# Patient Record
Sex: Female | Born: 1985 | Race: White | Hispanic: No | Marital: Married | Smoking: Former smoker
Health system: Southern US, Community
[De-identification: ages and names within clinical notes are randomized; demographics above are authoritative.]

## PROBLEM LIST (undated history)

## (undated) ENCOUNTER — Inpatient Hospital Stay (HOSPITAL_COMMUNITY): Payer: Self-pay

## (undated) DIAGNOSIS — F419 Anxiety disorder, unspecified: Secondary | ICD-10-CM

## (undated) DIAGNOSIS — IMO0002 Reserved for concepts with insufficient information to code with codable children: Secondary | ICD-10-CM

## (undated) DIAGNOSIS — F329 Major depressive disorder, single episode, unspecified: Secondary | ICD-10-CM

## (undated) DIAGNOSIS — Z8669 Personal history of other diseases of the nervous system and sense organs: Secondary | ICD-10-CM

## (undated) DIAGNOSIS — G43909 Migraine, unspecified, not intractable, without status migrainosus: Secondary | ICD-10-CM

## (undated) DIAGNOSIS — F32A Depression, unspecified: Secondary | ICD-10-CM

## (undated) DIAGNOSIS — R87619 Unspecified abnormal cytological findings in specimens from cervix uteri: Secondary | ICD-10-CM

## (undated) DIAGNOSIS — R87629 Unspecified abnormal cytological findings in specimens from vagina: Secondary | ICD-10-CM

## (undated) DIAGNOSIS — N200 Calculus of kidney: Secondary | ICD-10-CM

## (undated) DIAGNOSIS — B999 Unspecified infectious disease: Secondary | ICD-10-CM

## (undated) HISTORY — DX: Personal history of other diseases of the nervous system and sense organs: Z86.69

## (undated) HISTORY — DX: Anxiety disorder, unspecified: F41.9

## (undated) HISTORY — DX: Unspecified infectious disease: B99.9

## (undated) HISTORY — PX: TOOTH EXTRACTION: SUR596

## (undated) HISTORY — PX: WISDOM TOOTH EXTRACTION: SHX21

---
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
## 2005-03-06 ENCOUNTER — Emergency Department (HOSPITAL_COMMUNITY): Admission: EM | Admit: 2005-03-06 | Discharge: 2005-03-06 | Payer: Self-pay | Admitting: Emergency Medicine

## 2005-04-28 DIAGNOSIS — R87629 Unspecified abnormal cytological findings in specimens from vagina: Secondary | ICD-10-CM

## 2005-04-28 HISTORY — DX: Unspecified abnormal cytological findings in specimens from vagina: R87.629

## 2005-05-20 ENCOUNTER — Emergency Department (HOSPITAL_COMMUNITY): Admission: EM | Admit: 2005-05-20 | Discharge: 2005-05-20 | Payer: Self-pay | Admitting: Emergency Medicine

## 2005-05-20 IMAGING — CR DG CHEST 2V
2 series · 2 of 2 positions shown · non-contrast
Comparison: none

CLINICAL DATA: Left chest pain

Chest 2 view:
No previous for comparison. The heart size and mediastinal contours are within
normal limits.  Both lungs are clear.  The visualized skeletal structures are
unremarkable.

[w chest pa *]
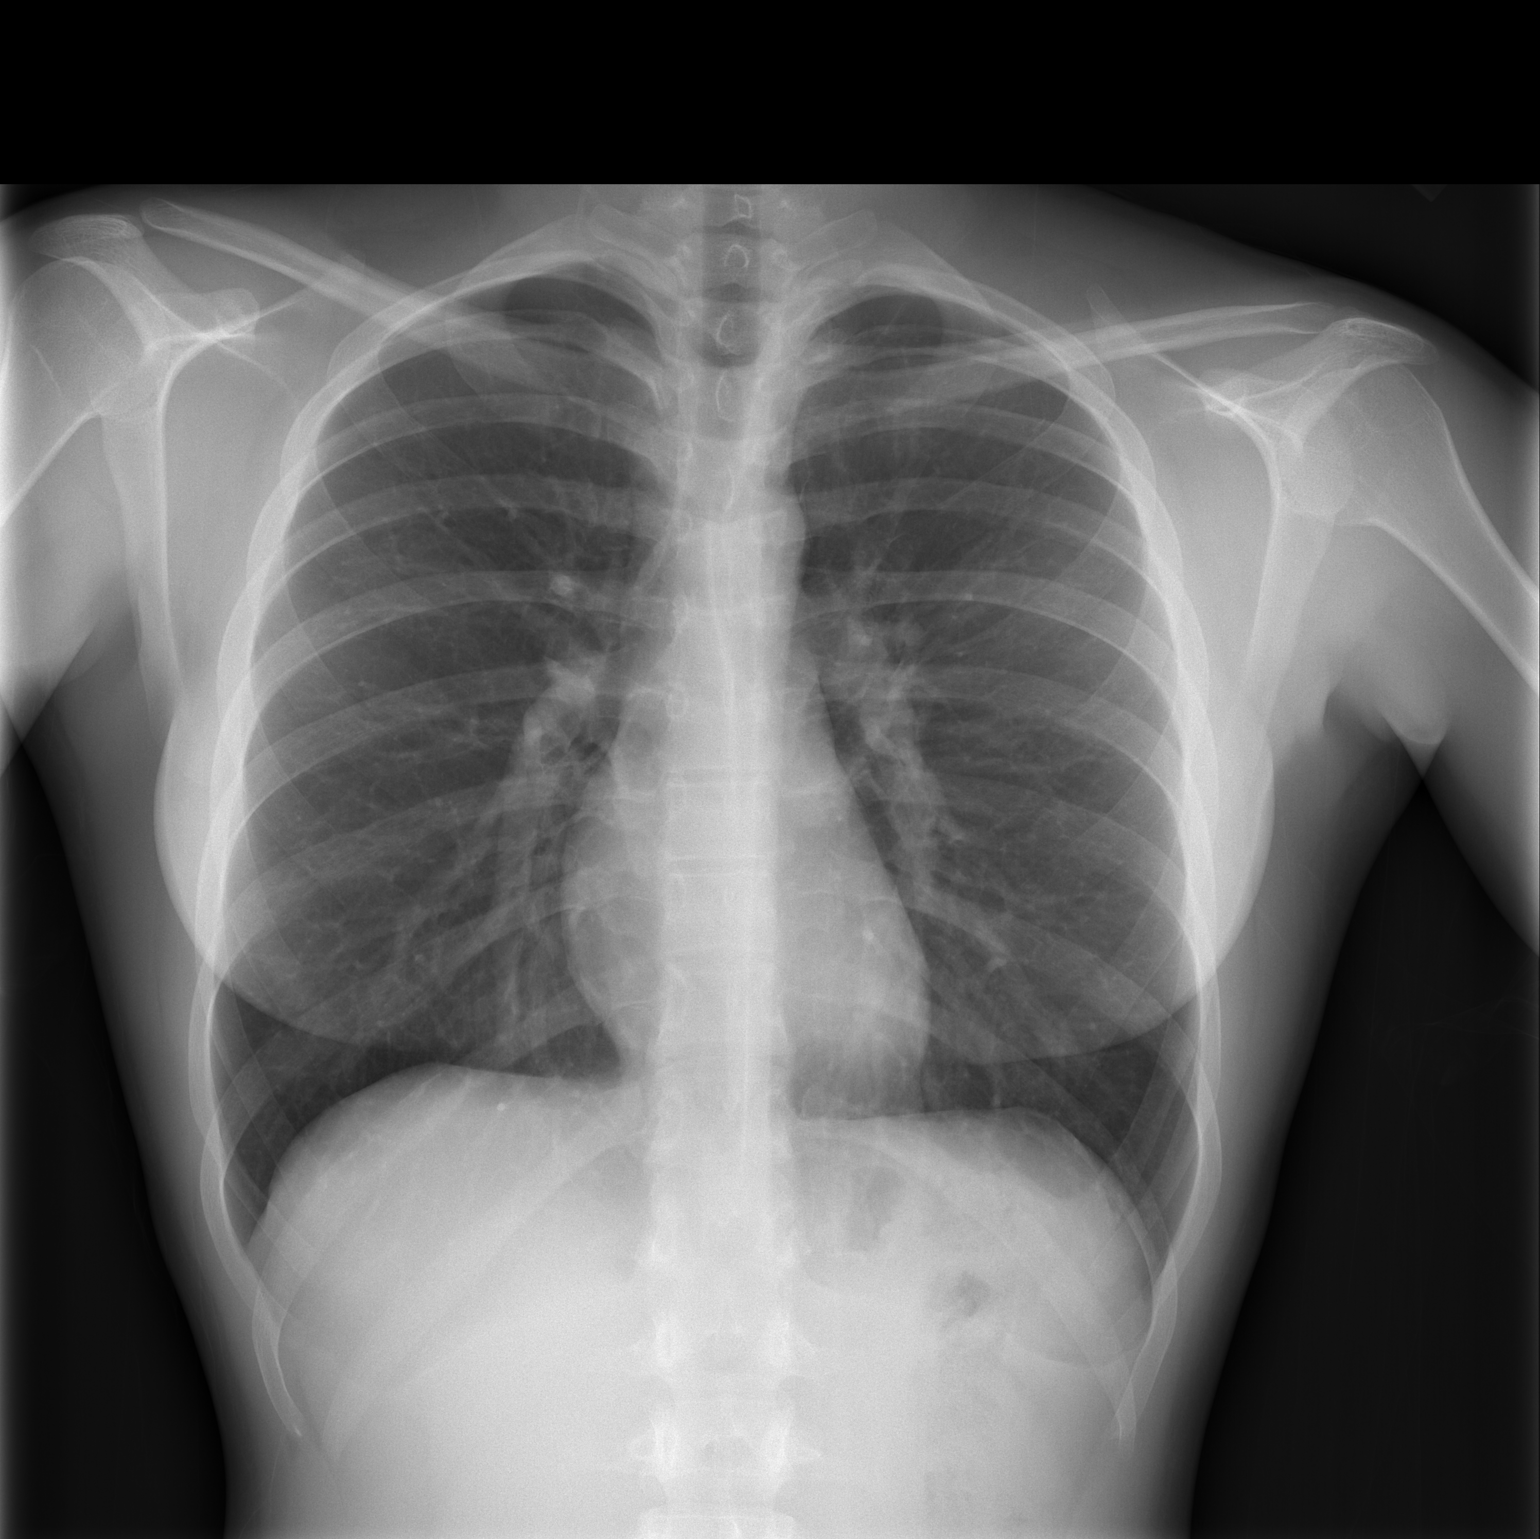

[w chest lat *]
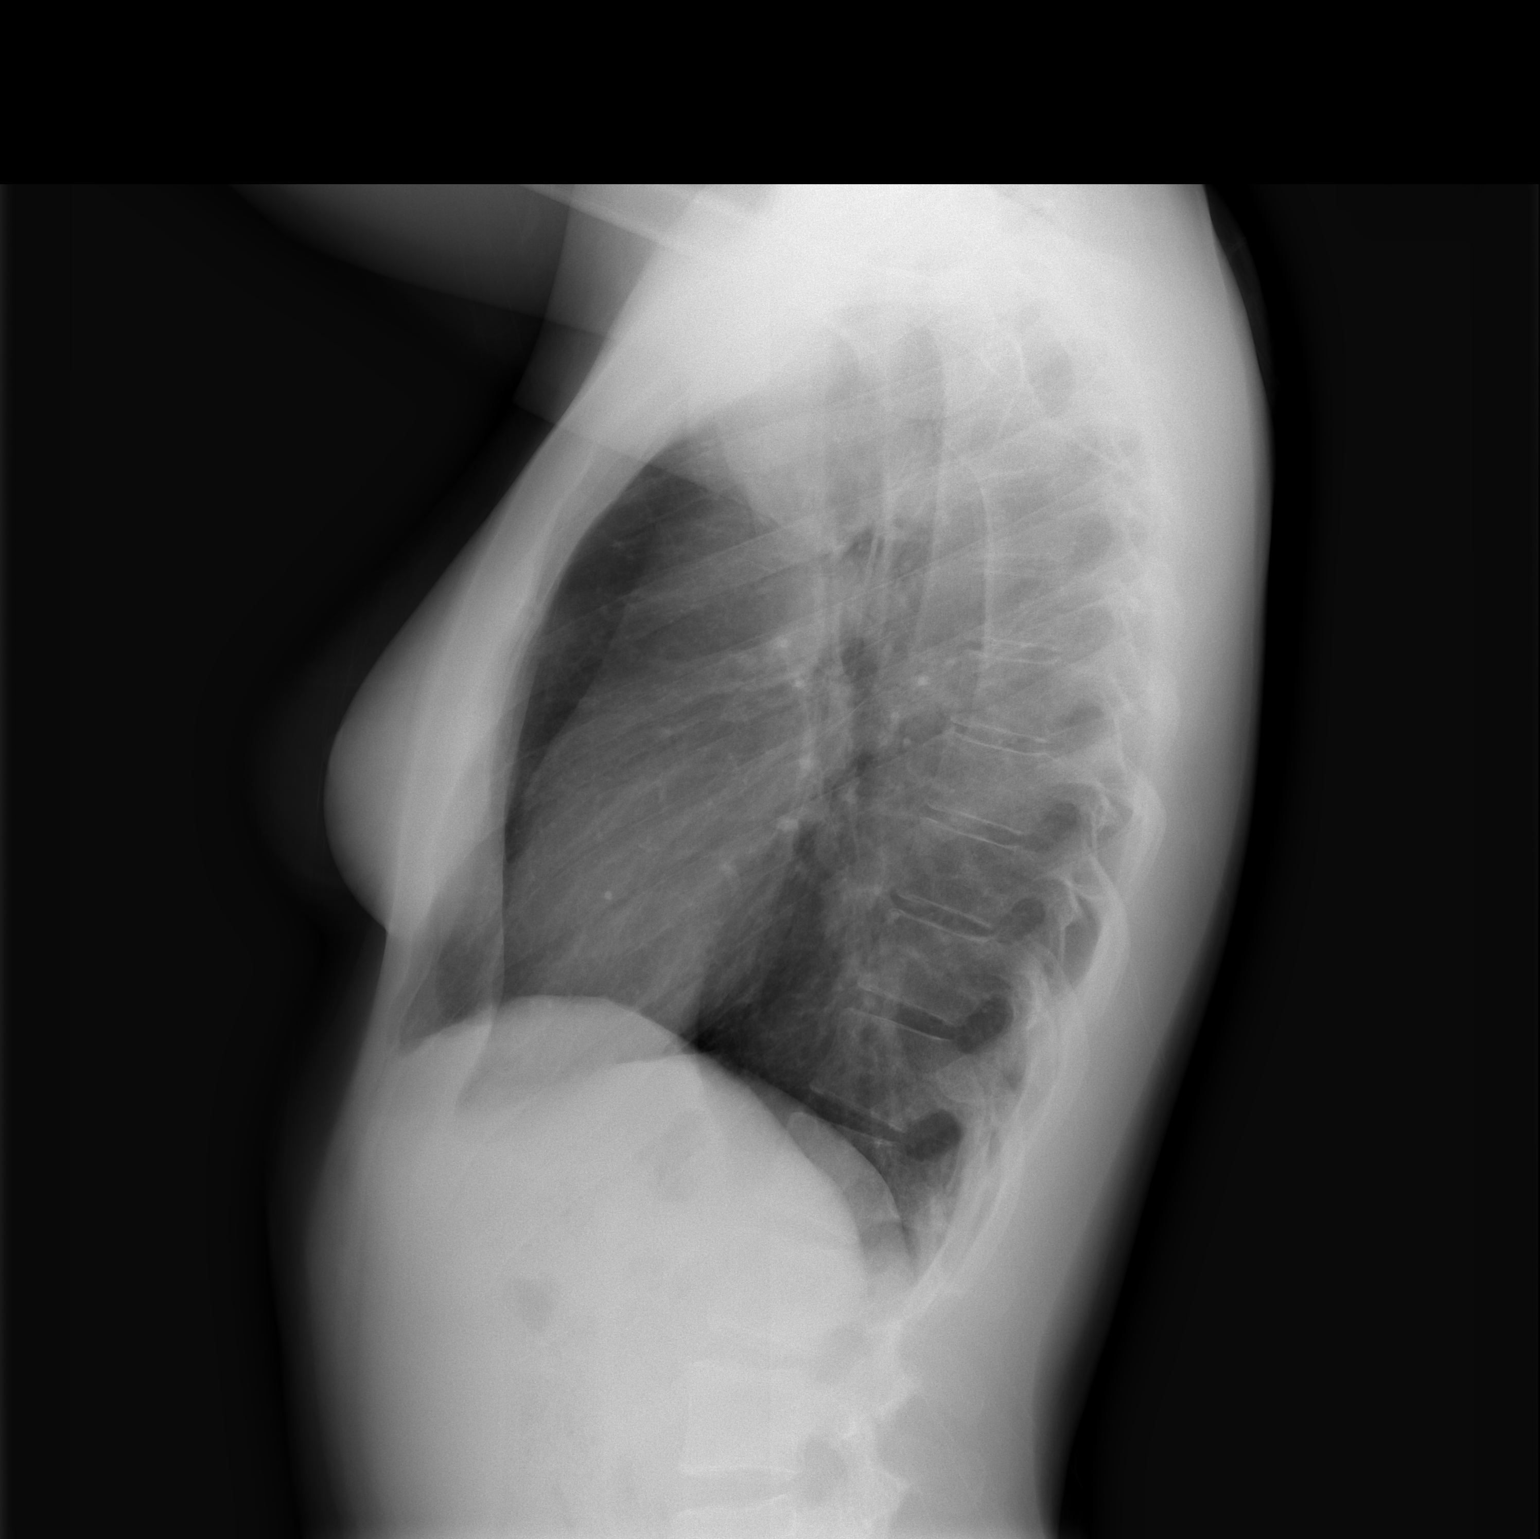

[2 of 2 positions shown; findings below may reference images not displayed]

IMPRESSION: 1. No active cardiopulmonary disease.

## 2011-02-06 ENCOUNTER — Ambulatory Visit (INDEPENDENT_AMBULATORY_CARE_PROVIDER_SITE_OTHER): Payer: Self-pay | Admitting: Gynecology

## 2011-02-06 DIAGNOSIS — Z3400 Encounter for supervision of normal first pregnancy, unspecified trimester: Secondary | ICD-10-CM

## 2011-02-06 DIAGNOSIS — O3680X0 Pregnancy with inconclusive fetal viability, not applicable or unspecified: Secondary | ICD-10-CM

## 2011-02-06 DIAGNOSIS — Z23 Encounter for immunization: Secondary | ICD-10-CM

## 2011-02-07 LAB — OBSTETRIC PANEL
Basophils Absolute: 0 10*3/uL (ref 0.0–0.1)
Eosinophils Absolute: 0.1 10*3/uL (ref 0.0–0.7)
Eosinophils Relative: 1 % (ref 0–5)
HCT: 38.4 % (ref 36.0–46.0)
Hepatitis B Surface Ag: NEGATIVE
MCHC: 33.6 g/dL (ref 30.0–36.0)
MCV: 89.1 fL (ref 78.0–100.0)
Platelets: 249 10*3/uL (ref 150–400)
RDW: 12.9 % (ref 11.5–15.5)

## 2011-02-08 LAB — CULTURE, URINE COMPREHENSIVE
Colony Count: NO GROWTH
Organism ID, Bacteria: NO GROWTH

## 2011-02-12 ENCOUNTER — Ambulatory Visit (HOSPITAL_COMMUNITY)
Admission: RE | Admit: 2011-02-12 | Discharge: 2011-02-12 | Disposition: A | Discharge status: Home / Self Care | Payer: Medicaid Other | Source: Ambulatory Visit | Attending: Family Medicine | Admitting: Family Medicine

## 2011-02-12 DIAGNOSIS — Z3689 Encounter for other specified antenatal screening: Secondary | ICD-10-CM | POA: Insufficient documentation

## 2011-02-12 DIAGNOSIS — O3680X0 Pregnancy with inconclusive fetal viability, not applicable or unspecified: Secondary | ICD-10-CM

## 2011-02-12 IMAGING — US US OB TRANSVAGINAL
1 series · 14 of 28 positions shown · non-contrast
Comparison: none

[Series 1: us ob comp less 14 wks · 14 of 53 slices shown]
[im 2/53]
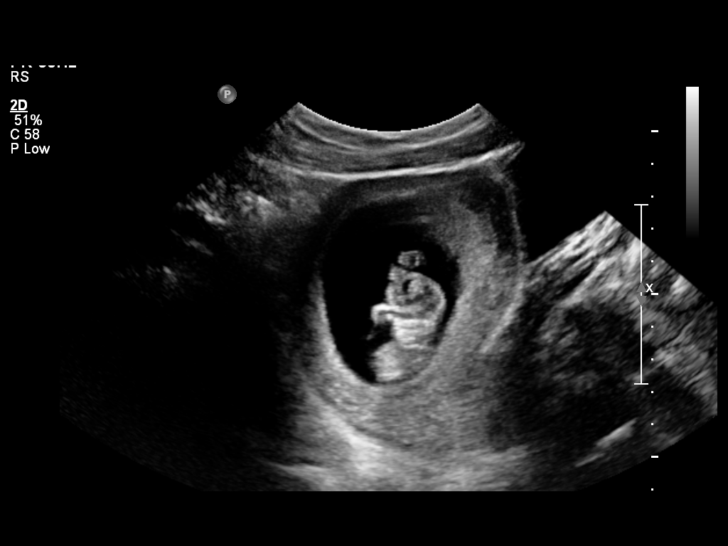
[im 6/53]
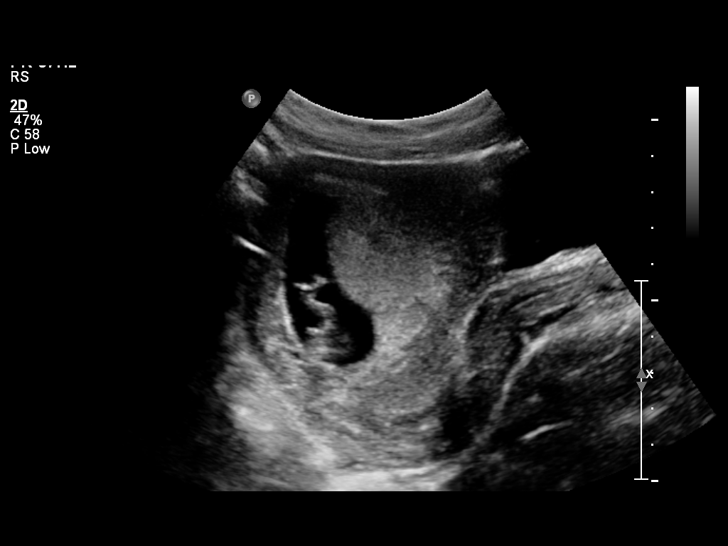
[im 10/53]
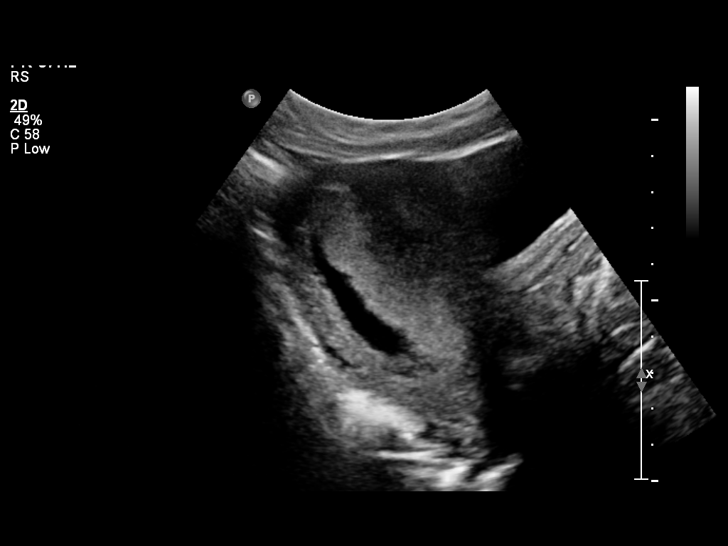
[im 14/53]
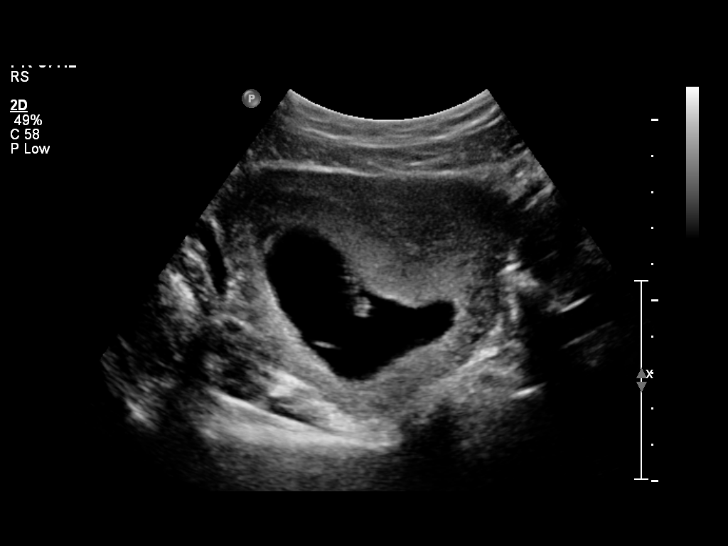
[im 18/53]
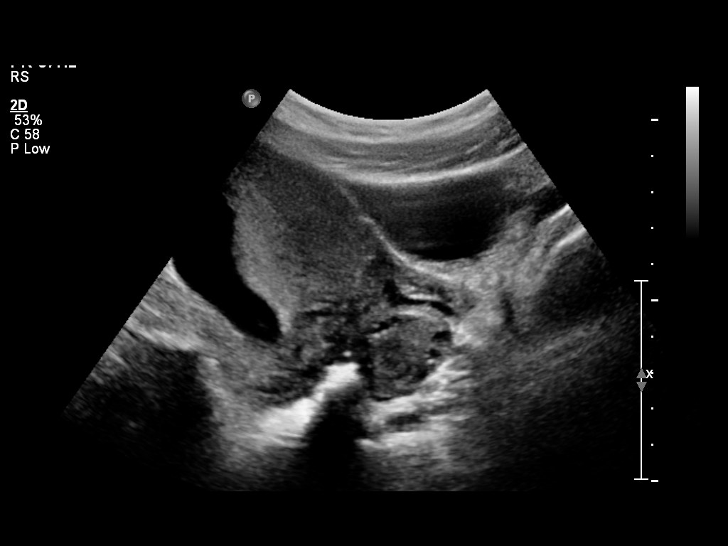
[im 22/53]
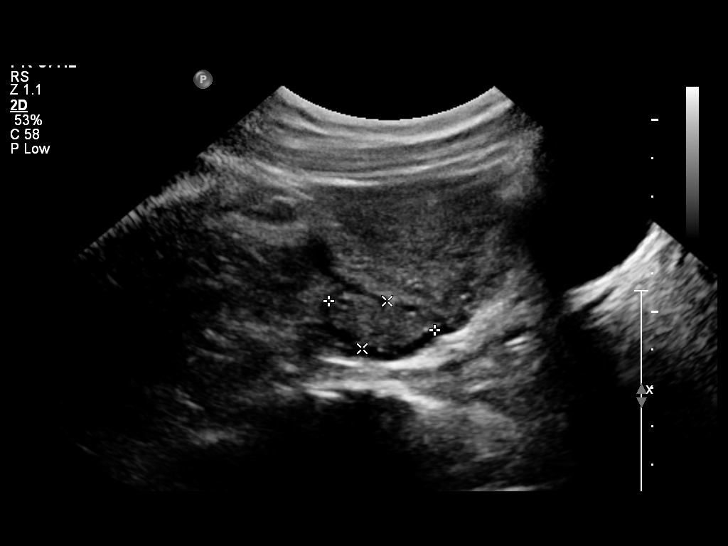
[im 26/53]
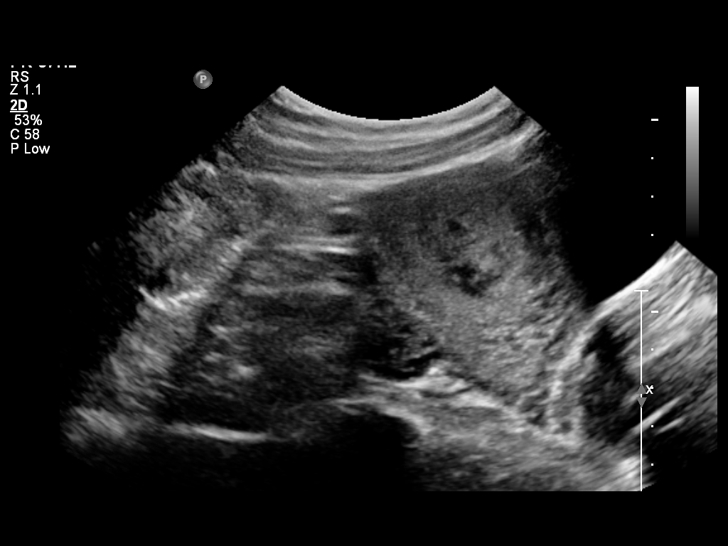
[im 29/53]
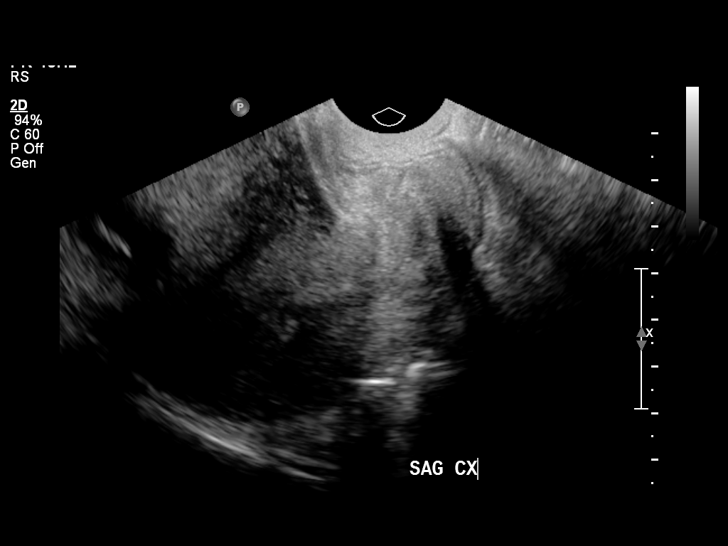
[im 33/53]
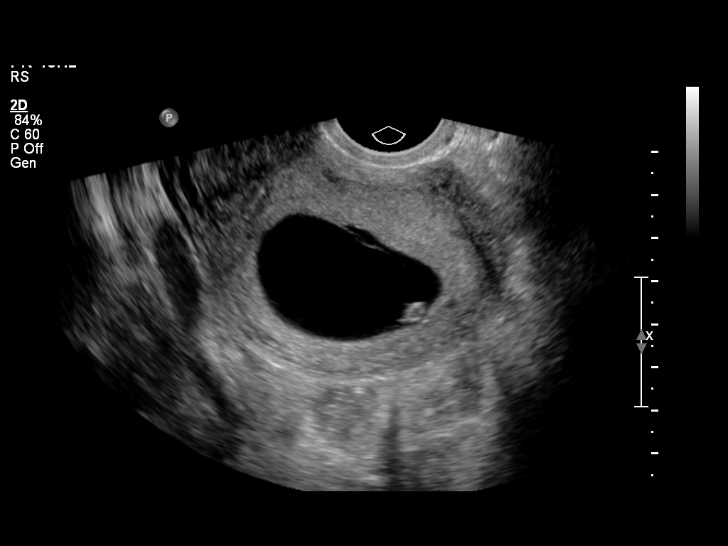
[im 37/53]
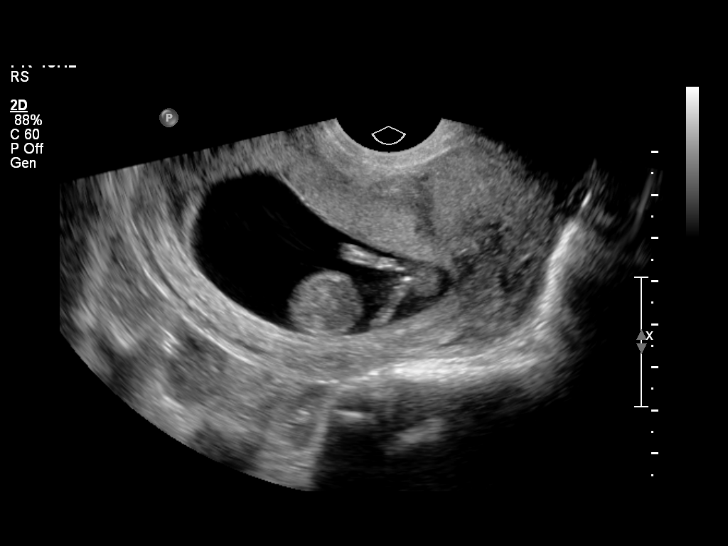
[im 41/53]
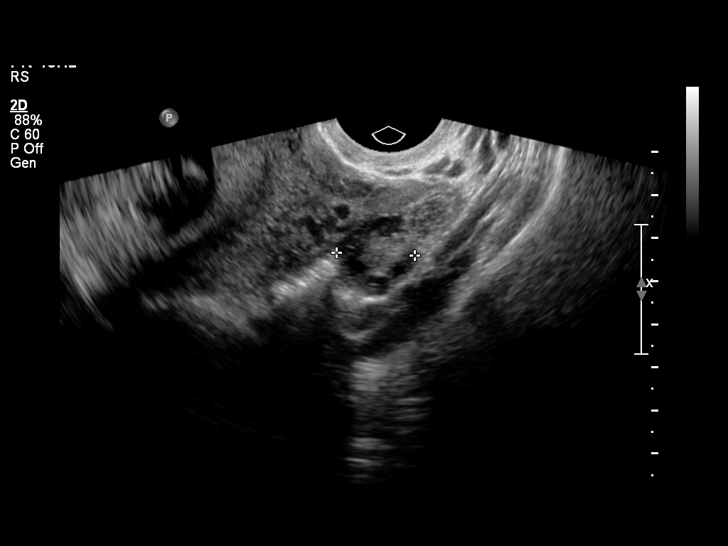
[im 45/53]
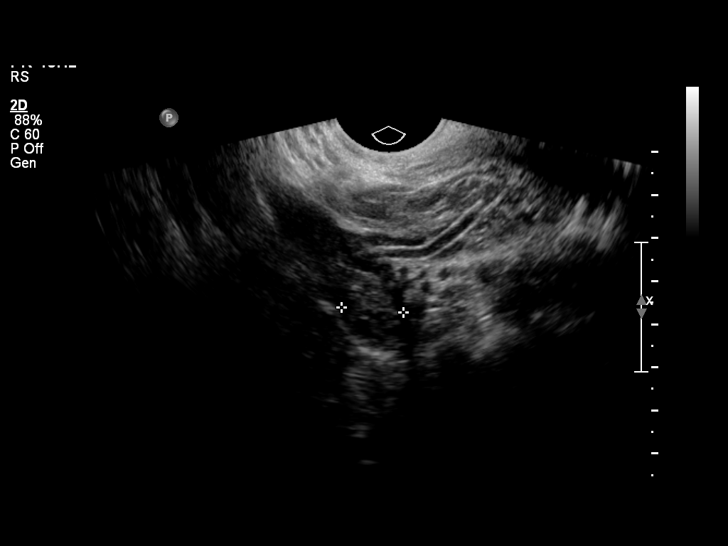
[im 49/53]
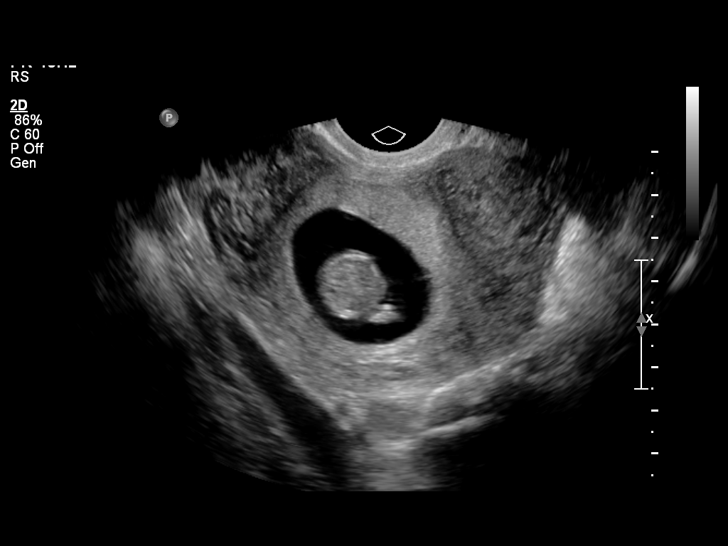
[im 53/53]
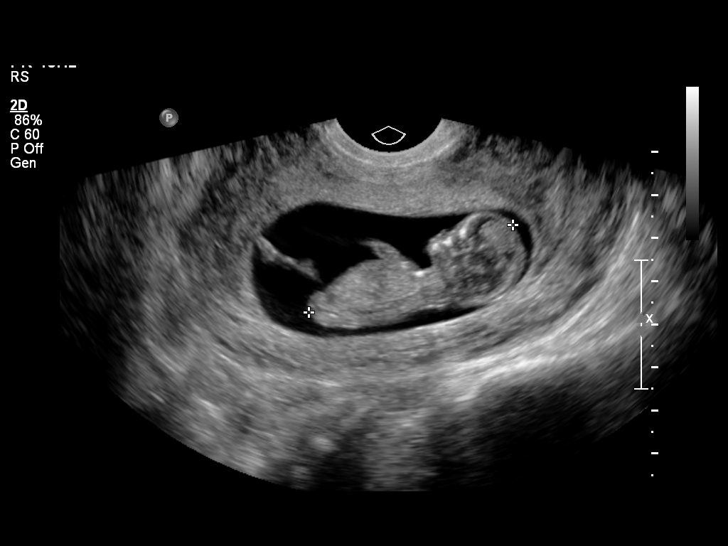

[14 of 28 positions shown; findings below may reference images not displayed]

OBSTETRICS REPORT
                      (Signed Final 02/12/2011 [DATE])

 Order#:         [id number]
                 46_O
Procedures

 US OB COMP LESS 14 WKS                                76801.0
 US OB TRANSVAGINAL                                    76817.0
Indications

 Unsure of LMP;  Establish Gestational [AGE]
Fetal Evaluation

 Fetal Heart Rate:  174                          bpm
 Cardiac Activity:  Observed
 Presentation:      Variable
 Placenta:          Anterior

 Amniotic Fluid
 AFI FV:      Subjectively within normal limits
Biometry

 CRL:     51.5  mm     G. Age:  11w 5d                 EDD:    08/29/11
Gestational Age

 LMP:           11w 2d        Date:  11/25/10                 EDD:   09/01/11
 Best:          11w 5d     Det. By:  U/S C R L (02/12/11)     EDD:   08/29/11
Cervix Uterus Adnexa

 Cervix:       Closed.

 Left Ovary:    Within normal limits. 3.2cm x 1.7cm x 1.8cm
 Right Ovary:   Within normal limits. 3.1cm x 1.2cm x 1.4cm
Impression

 Single living IUP with US Gest. Age of 11w 5d, and EDD of
 08/29/2011.
Recommendations

 US for fetal anatomic evaluation at 18-19 wks GA.

## 2011-02-19 ENCOUNTER — Ambulatory Visit (INDEPENDENT_AMBULATORY_CARE_PROVIDER_SITE_OTHER): Payer: Self-pay | Admitting: Family Medicine

## 2011-02-19 ENCOUNTER — Encounter: Payer: Self-pay | Admitting: Family Medicine

## 2011-02-19 VITALS — BP 108/64 | Wt 138.0 lb

## 2011-02-19 DIAGNOSIS — Z113 Encounter for screening for infections with a predominantly sexual mode of transmission: Secondary | ICD-10-CM

## 2011-02-19 DIAGNOSIS — Z1272 Encounter for screening for malignant neoplasm of vagina: Secondary | ICD-10-CM

## 2011-02-19 DIAGNOSIS — Z3480 Encounter for supervision of other normal pregnancy, unspecified trimester: Secondary | ICD-10-CM | POA: Insufficient documentation

## 2011-02-19 NOTE — Progress Notes (Signed)
Needs First screen.  S/p flu shot.  Subjective:    Joan Orozco is a G1P0 [redacted]w[redacted]d being seen today for her first obstetrical visit.  Her obstetrical history is significant for nothing. Patient does intend to breast feed. Pregnancy history fully reviewed.  Patient reports no complaints.  Filed Vitals:   02/19/11 0900  BP: 108/64  Weight: 138 lb (62.596 kg)    HISTORY: OB History    Grav Para Term Preterm Abortions TAB SAB Ect Mult Living   1              # Outc Date GA Lbr Len/2nd Wgt Sex Del Anes PTL Lv   1 CUR              Past Medical History  Diagnosis Date  . Anxiety     at younger age. was on lexapro  . Infection     bladder, yeast, uri.  . Asthma     childhood   Past Surgical History  Procedure Date  . Wisdom tooth extraction   . Tooth extraction    Family History  Problem Relation Age of Onset  . Adopted: Yes     Exam    Uterine Size: size equals dates  Pelvic Exam:    Perineum: No Hemorrhoids, Normal Perineum   Vulva: normal   Vagina:  normal mucosa       Cervix: anteverted   Adnexa: normal adnexa   Bony Pelvis: gynecoid       Skin: normal coloration and turgor, no rashes    Neurologic: oriented   Extremities: normal strength, tone, and muscle mass   HEENT PERRLA   Mouth/Teeth mucous membranes moist, pharynx normal without lesions   Neck supple   Cardiovascular: regular rate and rhythm   Respiratory:  appears well, vitals normal, no respiratory distress, acyanotic, normal RR, ear and throat exam is normal, neck free of mass or lymphadenopathy, chest clear, no wheezing, crepitations, rhonchi, normal symmetric air entry   Abdomen: soft, non-tender; bowel sounds normal; no masses,  no organomegaly   Urinary: urethral meatus normal      Assessment:    Pregnancy: G1P0 Patient Active Problem List  Diagnoses  . Supervision of other normal pregnancy        Plan:     Initial labs drawn. Prenatal vitamins. Problem list reviewed  and updated. Genetic Screening discussed First Screen: ordered.  Ultrasound discussed; fetal survey: discussed.  Follow up in 4 weeks.   Justino Boze S 02/19/2011

## 2011-02-19 NOTE — Patient Instructions (Addendum)
Prenatal Care  WHAT IS PRENATAL CARE?  Prenatal care means health care during your pregnancy, before your baby is born. Take care of yourself and your baby by:   Getting early prenatal care. If you know you are pregnant, or think you might be pregnant, call your caregiver as soon as possible. Schedule a visit for a general/prenatal examination.   Getting regular prenatal care. Follow your caregiver's schedule for blood and other necessary tests. Do not miss appointments.   Do everything you can to keep yourself and your baby healthy during your pregnancy.   Prenatal care should include evaluation of medical, dietary, educational, psychological, and social needs for the couple and the medical, surgical, and genetic history of the family of the mother and father.   Discuss with your caregiver:   Your medicines, prescription, over-the-counter, and herbal medicines.   Substance abuse, alcohol, smoking, and illegal drugs.   Domestic abuse and violence, if present.   Your immunizations.   Nutrition and diet.   Exercising.   Environment and occupational hazards, at home and at work.   History of sexually transmitted disease, both you and your partner.   Previous pregnancies.  WHY IS PRENATAL CARE SO IMPORTANT?  By seeing you regularly, your caregiver has the chance to find problems early, so that they can be treated as soon as possible. Other problems might be prevented. Many studies have shown that early and regular prenatal care is important for the health of both mothers and their babies.  I AM THINKING ABOUT GETTING PREGNANT. HOW CAN I TAKE CARE OF MYSELF?  Taking care of yourself before you get pregnant helps you to have a healthy pregnancy. It also lowers your chances of having a baby born with a birth defect. Here are ways to take care of yourself before you get pregnant:   Eat healthy foods, exercise regularly (30 minutes per day for most days of the week is best), and get enough  rest and sleep. Talk to your caregiver about what kinds of foods and exercises are best for you.   Take 400 micrograms (mcg) of folic acid (one of the B vitamins) every day. The best way to do this is to take a daily multivitamin pill that contains this amount of folic acid. Getting enough of the synthetic (manufactured) form of folic acid every day before you get pregnant and during early pregnancy can help prevent certain birth defects. Many breakfast cereals and other grain products have folic acid added to them, but only certain cereals contain 400 mcg of folic acid per serving. Check the label on your multivitamin or cereal to find the amount of folic acid in the food.   See your caregiver for a complete check up before getting pregnant. Make sure that you have had all your immunization shots, especially for rubella ([place] measles). Rubella can cause serious birth defects. Chickenpox is another illness you want to avoid during pregnancy. If you have had chickenpox and rubella in the past, you should be immune to them.   Tell your caregiver about any prescription or non-prescription medicines (including herbal remedies) you are taking. Some medicines are not safe to take during pregnancy.   Stop smoking cigarettes, drinking alcohol, or taking illegal drugs. Ask your caregiver for help, if you need it. You can also get help with alcohol and drugs by talking with a member of your faith community, a counselor, or a trusted friend.   Discuss and treat any medical, social, or psychological   problems before getting pregnant.   Discuss any history of genetic problems in the mother, father, and their families. Do genetic testing before getting pregnant, when possible.   Discuss any physical or emotional abuse with your caregiver.   Discuss with your caregiver if you might be exposed to harmful chemicals on your job or where you live.   Discuss with your caregiver if you think your job or the hours you  work may be harmful and should be changed.   The father should be involved with the decision making and with all aspects of the pregnancy, labor, and delivery.   If you have medical insurance, make sure you are covered for pregnancy.  I JUST FOUND OUT THAT I AM PREGNANT. HOW CAN I TAKE CARE OF MYSELF?  Here are ways to take care of yourself and the precious new life growing inside you:   Continue taking your multivitamin with 400 micrograms (mcg) of folic acid every day.   Get early and regular prenatal care. It does not matter if this is your first pregnancy or if you already have children. It is very important to see a caregiver during your pregnancy. Your caregiver will check at each visit to make sure that you and the baby are healthy. If there are any problems, action can be taken right away to help you and the baby.   Eat a healthy diet that includes:   Fruits.   Vegetables.   Foods low in saturated fat.   Grains.   Calcium-rich foods.   Drink 6 to 8 glasses of liquids a day.   Unless your caregiver tells you not to, try to be physically active for 30 minutes, most days of the week. If you are pressed for time, you can get your activity in through 10 minute segments, three times a day.   If you smoke, drink alcohol, or use drugs, STOP. These can cause long-term damage to your baby. Talk with your caregiver about steps to take to stop smoking. Talk with a member of your faith community, a counselor, a trusted friend, or your caregiver if you are concerned about your alcohol or drug use.   Ask your caregiver before taking any medicine, even over-the-counter medicines. Some medicines are not safe to take during pregnancy.   Get plenty of rest and sleep.   Avoid hot tubs and saunas during pregnancy.   Do not have X-rays taken, unless absolutely necessary and with the recommendation of your caregiver. A lead shield can be placed on your abdomen, to protect the baby when X-rays  are taken in other parts of the body.   Do not empty the cat litter when you are pregnant. It may contain a parasite that causes an infection called toxoplasmosis, which can cause birth defects. Also, use gloves when working in garden areas used by cats.   Do not eat uncooked or undercooked cheese, meats, or fish.   Stay away from toxic chemicals like:   Insecticides.   Solvents (some cleaners or paint thinners).   Lead.   Mercury.   Sexual relations may continue until the end of the pregnancy, unless you have a medical problem or there is a problem with the pregnancy and your caregiver tells you not to.   Do not wear high heel shoes, especially during the second half of the pregnancy. You can lose your balance and fall.   Do not take long trips, unless absolutely necessary. Be sure to see your caregiver before   going on the trip.   Do not sit in one position for more than 2 hours, when on a trip.   Take a copy of your medical records when going on a trip.   Know where there is a hospital in the city you are visiting, in case of an emergency.   Most dangerous household products will have pregnancy warnings on their labels. Ask your caregiver about products if you are unsure.   Limit or eliminate your caffeine intake from coffee, tea, sodas, medicines, and chocolate.   Many women continue working through pregnancy. Staying active might help you stay healthier. If you have a question about the safety or the hours you work at your particular job, talk with your caregiver.   Get informed:   Read books.   Watch videos.   Go to childbirth classes for you and the father.   Talk with experienced moms.   Ask your caregiver about childbirth education classes for you and your partner. Classes can help you and your partner prepare for the birth of your baby.   Ask about a pediatrician (baby doctor) and methods and pain medicine for labor, delivery, and possible Cesarean delivery  (C-section).  I AM NOT THINKING ABOUT GETTING PREGNANT RIGHT NOW, BUT HEARD THAT ALL WOMEN SHOULD TAKE FOLIC ACID EVERY DAY?  All women of childbearing age, with even a remote chance of getting pregnant, should try to make sure they get enough folic acid. Many pregnancies are not planned. Many women do not know they are actually pregnant early in their pregnancies, and certain birth defects happen in the very early part of pregnancy. Taking 400 micrograms (mcg) of folic acid every day will help prevent certain birth defects that happen in the early part of pregnancy. If a woman begins taking vitamin pills in the second or third month of pregnancy, it may be too late to prevent birth defects. Folic acid may also have other health benefits for women, besides preventing birth defects.  HOW OFTEN SHOULD I SEE MY CAREGIVER DURING PREGNANCY?  Your caregiver will give you a schedule for your prenatal visits. You will have visits more often as you get closer to the end of your pregnancy. An average pregnancy lasts about 40 weeks.  A typical schedule includes visiting your caregiver:   About once each month, during your first 6 months of pregnancy.   Every 2 weeks, during the next 2 months.   Weekly in the last month, until the delivery date.  Your caregiver will probably want to see you more often if:  You are over 35.   Your pregnancy is high risk, because you have certain health problems or problems with the pregnancy, such as:   Diabetes.   High blood pressure.   The baby is not growing on schedule, according to the dates of the pregnancy.  Your caregiver will do special tests, to make sure you and the baby are not having any serious problems. WHAT HAPPENS DURING PRENATAL VISITS?   At your first prenatal visit, your caregiver will talk to you about you and your partner's health history and your family's health history, and will do a physical exam.   On your first visit, a physical exam will  include checks of your blood pressure, height and weight, and an exam of your pelvic organs. Your caregiver will do a Pap test if you have not had one recently, and will do cultures of your cervix to make sure there is no   infection.   At each visit, there will be tests of your blood, urine, blood pressure, weight, and checking the progress of the baby.   Your caregiver will be able to tell you when to expect that your baby will be born.   Each visit is also a chance for you to learn about staying healthy during pregnancy and for asking questions.   Discuss whether you will be breastfeeding.   At your later prenatal visits, your caregiver will check how you are doing and how the baby is developing. You may have a number of tests done as your pregnancy progresses.   Ultrasound exams are often used to check on the baby's growth and health.   You may have more urine and blood tests, as well as special tests, if needed. These may include amniocentesis (examine fluid in the pregnancy sac), stress tests (check how baby responds to contractions), biophysical profile (measures fetus well-being). Your caregiver will explain the tests and why they are necessary.  I AM IN MY LATE THIRTIES, AND I WANT TO HAVE A CHILD NOW. SHOULD I DO ANYTHING SPECIAL?  As you get older, there is more chance of having a medical problem (high blood pressure), pregnancy problem (preeclampsia, problems with the placenta), miscarriage, or a baby born with a birth defect. However, most women in their late thirties and early forties have healthy babies. See your caregiver on a regular basis before you get pregnant and be sure to go for exams throughout your pregnancy. Your caregiver probably will want to do some special tests to check on you and your baby's health when you are pregnant.  Women today are often delaying having children until later in life, when they are in their thirties and forties. While many women in their thirties  and forties have no difficulty getting pregnant, fertility does decline with age. For women over 40 who cannot get pregnant after 6 months of trying, it is recommended that they see their caregiver for a fertility evaluation. It is not uncommon to have trouble becoming pregnant or experience infertility (inability to become pregnant after trying for one year). If you think that you or your partner may be infertile, you can discuss this with your caregiver. He or she can recommend treatments such as drugs, surgery, or assisted reproductive technology.  Document Released: 04/17/2003 Document Revised: 12/25/2010 Document Reviewed: 03/14/2009 [facility] Patient Information 2012 [place], [place]. Pregnancy - First Trimester During sexual intercourse, millions of sperm go into the vagina. Only 1 sperm will penetrate and fertilize the female egg while it is in the Fallopian tube. One week later, the fertilized egg implants into the wall of the uterus. An embryo begins to develop into a baby. At 6 to 8 weeks, the eyes and face are formed and the heartbeat can be seen on ultrasound. At the end of 12 weeks (first trimester), all the baby's organs are formed. Now that you are pregnant, you will want to do everything you can to have a healthy baby. Two of the most important things are to get good prenatal care and follow your caregiver's instructions. Prenatal care is all the medical care you receive before the baby's birth. It is given to prevent, find, and treat problems during the pregnancy and childbirth. PRENATAL EXAMS  During prenatal visits, your weight, blood pressure and urine are checked. This is done to make sure you are healthy and progressing normally during the pregnancy.   A pregnant woman should gain 25 to 35 pounds  during the pregnancy. However, if you are over weight or underweight, your caregiver will advise you regarding your weight.   Your caregiver will ask and answer questions for you.   Blood  work, cervical cultures, other necessary tests and a Pap test are done during your prenatal exams. These tests are done to check on your health and the probable health of your baby. Tests are strongly recommended and done for HIV with your permission. This is the virus that causes AIDS. These tests are done because medications can be given to help prevent your baby from being born with this infection should you have been infected without knowing it. Blood work is also used to find out your blood type, previous infections and follow your blood levels (hemoglobin).   Low hemoglobin (anemia) is common during pregnancy. Iron and vitamins are given to help prevent this. Later in the pregnancy, blood tests for diabetes will be done along with any other tests if any problems develop. You may need tests to make sure you and the baby are doing well.   You may need other tests to make sure you and the baby are doing well.  CHANGES DURING THE FIRST TRIMESTER (THE FIRST 3 MONTHS OF PREGNANCY) Your body goes through many changes during pregnancy. They vary from person to person. Talk to your caregiver about changes you notice and are concerned about. Changes can include:  Your menstrual period stops.   The egg and sperm carry the genes that determine what you look like. Genes from you and your partner are forming a baby. The female genes determine whether the baby is a boy or a girl.   Your body increases in girth and you may feel bloated.   Feeling sick to your stomach (nauseous) and throwing up (vomiting). If the vomiting is uncontrollable, call your caregiver.   Your breasts will begin to enlarge and become tender.   Your nipples may stick out more and become darker.   The need to urinate more. Painful urination may mean you have a bladder infection.   Tiring easily.   Loss of appetite.   Cravings for certain kinds of food.   At first, you may gain or lose a couple of pounds.   You may have changes  in your emotions from day to day (excited to be pregnant or concerned something may go wrong with the pregnancy and baby).   You may have more vivid and strange dreams.  HOME CARE INSTRUCTIONS   It is very important to avoid all smoking, alcohol and un-prescribed drugs during your pregnancy. These affect the formation and growth of the baby. Avoid chemicals while pregnant to ensure the delivery of a healthy infant.   Start your prenatal visits by the 12th week of pregnancy. They are usually scheduled monthly at first, then more often in the last 2 months before delivery. Keep your caregiver's appointments. Follow your caregiver's instructions regarding medication use, blood and lab tests, exercise, and diet.   During pregnancy, you are providing food for you and your baby. Eat regular, well-balanced meals. Choose foods such as meat, fish, milk and other low fat dairy products, vegetables, fruits, and whole-grain breads and cereals. Your caregiver will tell you of the ideal weight gain.   You can help morning sickness by keeping soda crackers at the bedside. Eat a couple before arising in the morning. You may want to use the crackers without salt on them.   Eating 4 to 5 small meals  rather than 3 large meals a day also may help the nausea and vomiting.   Drinking liquids between meals instead of during meals also seems to help nausea and vomiting.   A physical sexual relationship may be continued throughout pregnancy if there are no other problems. Problems may be early (premature) leaking of amniotic fluid from the membranes, vaginal bleeding, or belly (abdominal) pain.   Exercise regularly if there are no restrictions. Check with your caregiver or physical therapist if you are unsure of the safety of some of your exercises. Greater weight gain will occur in the last 2 trimesters of pregnancy. Exercising will help:   Control your weight.   Keep you in shape.   Prepare you for labor and  delivery.   Help you lose your pregnancy weight after you deliver your baby.   Wear a good support or jogging bra for breast tenderness during pregnancy. This may help if worn during sleep too.   Ask when prenatal classes are available. Begin classes when they are offered.   Do not use hot tubs, steam rooms or saunas.   Wear your seat belt when driving. This protects you and your baby if you are in an accident.   Avoid raw meat, uncooked cheese, cat litter boxes and soil used by cats throughout the pregnancy. These carry germs that can cause birth defects in the baby.   The first trimester is a good time to visit your dentist for your dental health. Getting your teeth cleaned is OK. Use a softer toothbrush and brush gently during pregnancy.   Ask for help if you have financial, counseling or nutritional needs during pregnancy. Your caregiver will be able to offer counseling for these needs as well as refer you for other special needs.   Do not take any medications or herbs unless told by your caregiver.   Inform your caregiver if there is any mental or physical domestic violence.   Make a list of emergency phone numbers of family, friends, hospital, and police and fire departments.   Write down your questions. Take them to your prenatal visit.   Do not douche.   Do not cross your legs.   If you have to stand for long periods of time, rotate you feet or take small steps in a circle.   You may have more vaginal secretions that may require a sanitary pad. Do not use tampons or scented sanitary pads.  MEDICATIONS AND DRUG USE IN PREGNANCY  Take prenatal vitamins as directed. The vitamin should contain 1 milligram of folic acid. Keep all vitamins out of reach of children. Only a couple vitamins or tablets containing iron may be fatal to a baby or young child when ingested.   Avoid use of all medications, including herbs, over-the-counter medications, not prescribed or suggested by  your caregiver. Only take over-the-counter or prescription medicines for pain, discomfort, or fever as directed by your caregiver. Do not use aspirin, ibuprofen, or naproxen unless directed by your caregiver.   Let your caregiver also know about herbs you may be using.   Alcohol is related to a number of birth defects. This includes fetal alcohol syndrome. All alcohol, in any form, should be avoided completely. Smoking will cause low birth rate and premature babies.   Street or illegal drugs are very harmful to the baby. They are absolutely forbidden. A baby born to an addicted mother will be addicted at birth. The baby will go through the same withdrawal an adult  does.   Let your caregiver know about any medications that you have to take and for what reason you take them.  MISCARRIAGE IS COMMON DURING PREGNANCY A miscarriage does not mean you did something wrong. It is not a reason to worry about getting pregnant again. Your caregiver will help you with questions you may have. If you have a miscarriage, you may need minor surgery. SEEK MEDICAL CARE IF:  You have any concerns or worries during your pregnancy. It is better to call with your questions if you feel they cannot wait, rather than worry about them. SEEK IMMEDIATE MEDICAL CARE IF:   An unexplained oral temperature above 102 F (38.9 C) develops, or as your caregiver suggests.   You have leaking of fluid from the vagina (birth canal). If leaking membranes are suspected, take your temperature and inform your caregiver of this when you call.   There is vaginal spotting or bleeding. Notify your caregiver of the amount and how many pads are used.   You develop a bad smelling vaginal discharge with a change in the color.   You continue to feel sick to your stomach (nauseated) and have no relief from remedies suggested. You vomit blood or coffee ground-like materials.   You lose more than 2 pounds of weight in 1 week.   You gain more  than 2 pounds of weight in 1 week and you notice swelling of your face, hands, feet, or legs.   You gain 5 pounds or more in 1 week (even if you do not have swelling of your hands, face, legs, or feet).   You get exposed to [place] measles and have never had them.   You are exposed to fifth disease or chickenpox.   You develop belly (abdominal) pain. Round ligament discomfort is a common non-cancerous (benign) cause of abdominal pain in pregnancy. Your caregiver still must evaluate this.   You develop headache, fever, diarrhea, pain with urination, or shortness of breath.   You fall or are in a car accident or have any kind of trauma.   There is mental or physical violence in your home.  Document Released: 04/08/2001 Document Revised: 12/25/2010 Document Reviewed: 10/10/2008 [facility] Patient Information 2012 [place], [place].

## 2011-02-24 ENCOUNTER — Ambulatory Visit (HOSPITAL_COMMUNITY)
Admission: RE | Admit: 2011-02-24 | Discharge: 2011-02-24 | Disposition: A | Discharge status: Home / Self Care | Payer: Medicaid Other | Source: Ambulatory Visit | Attending: Family Medicine | Admitting: Family Medicine

## 2011-02-24 ENCOUNTER — Other Ambulatory Visit: Payer: Self-pay

## 2011-02-24 DIAGNOSIS — Z3689 Encounter for other specified antenatal screening: Secondary | ICD-10-CM | POA: Insufficient documentation

## 2011-02-24 DIAGNOSIS — O3510X0 Maternal care for (suspected) chromosomal abnormality in fetus, unspecified, not applicable or unspecified: Secondary | ICD-10-CM | POA: Insufficient documentation

## 2011-02-24 DIAGNOSIS — O351XX0 Maternal care for (suspected) chromosomal abnormality in fetus, not applicable or unspecified: Secondary | ICD-10-CM | POA: Insufficient documentation

## 2011-02-24 DIAGNOSIS — Z3480 Encounter for supervision of other normal pregnancy, unspecified trimester: Secondary | ICD-10-CM

## 2011-02-24 IMAGING — US US OB NUCHAL TRANSLUCENCY 1ST GEST
1 series · 14 of 28 positions shown · non-contrast
Comparison: none

[Series 1: us ob nuchal translucency 1st gest · 0.20mm/px · 14 of 45 slices shown]
[im 2/45]
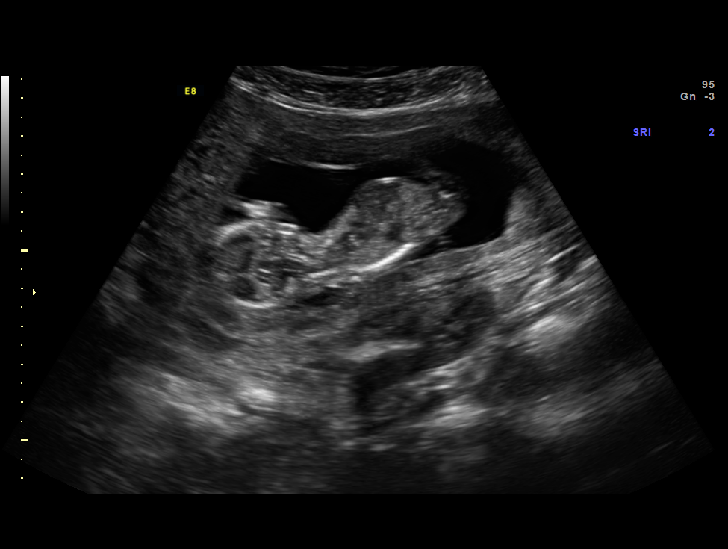
[im 5/45]
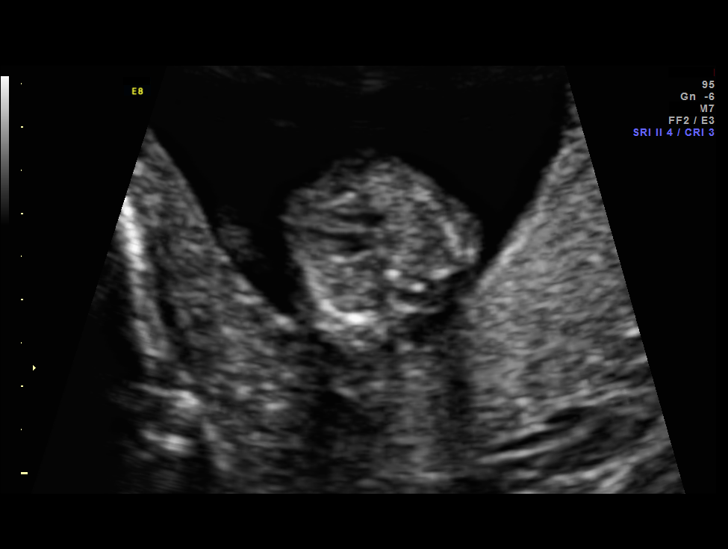
[im 9/45]
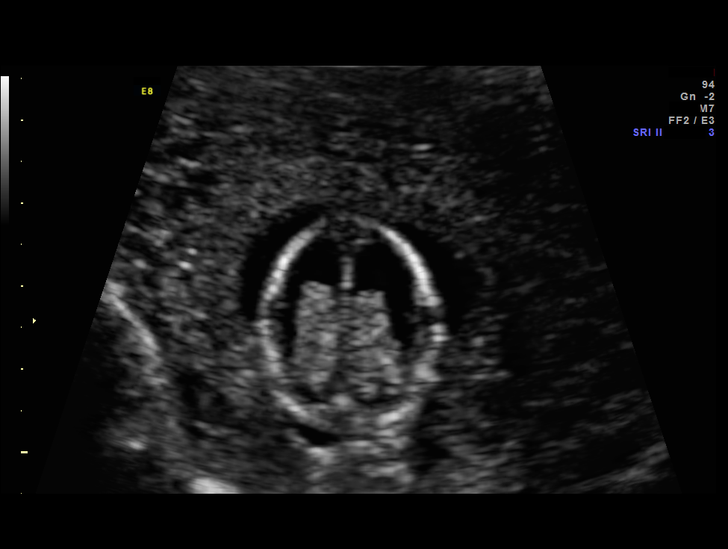
[im 12/45]
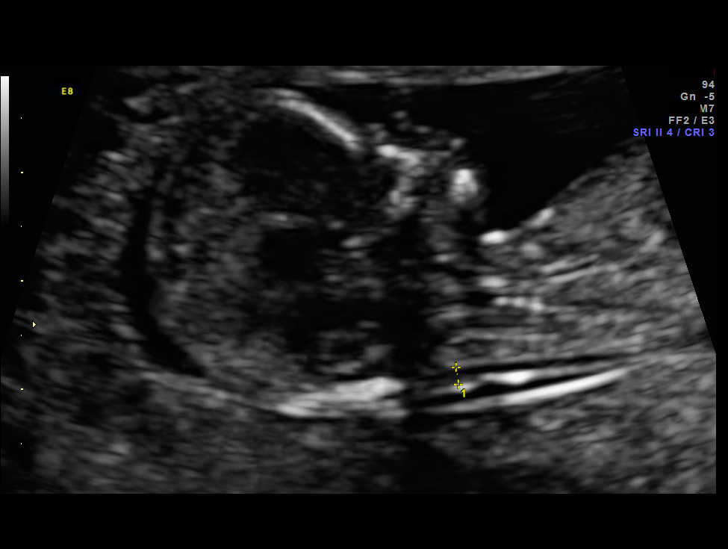
[im 15/45]
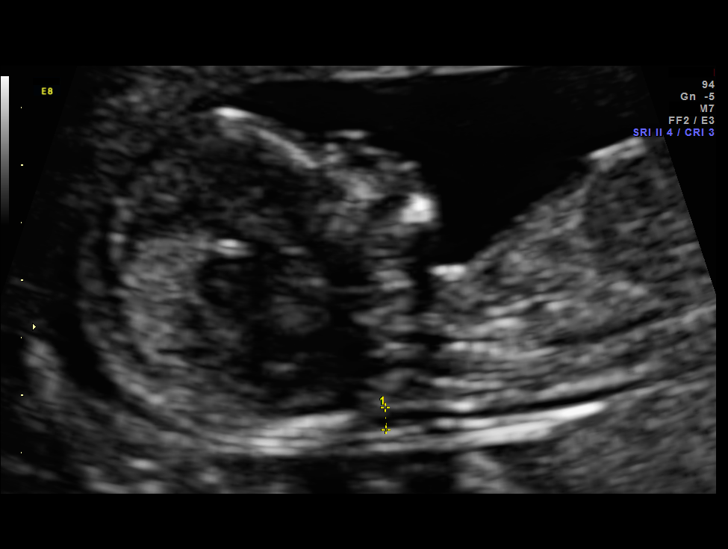
[im 18/45]
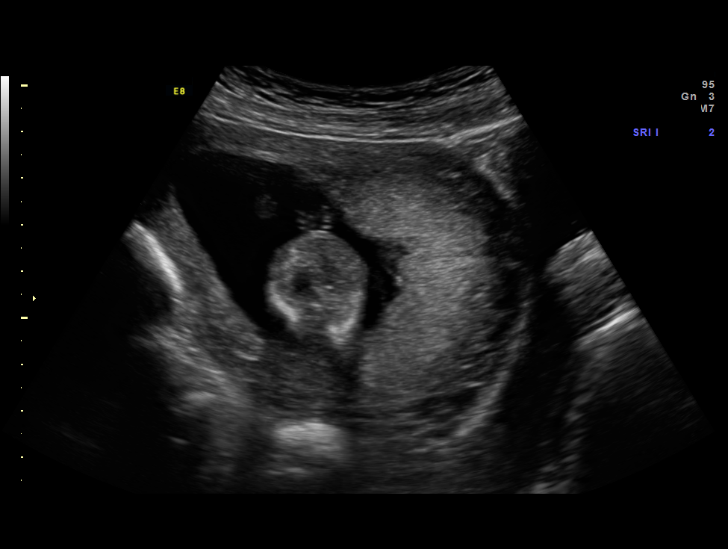
[im 22/45]
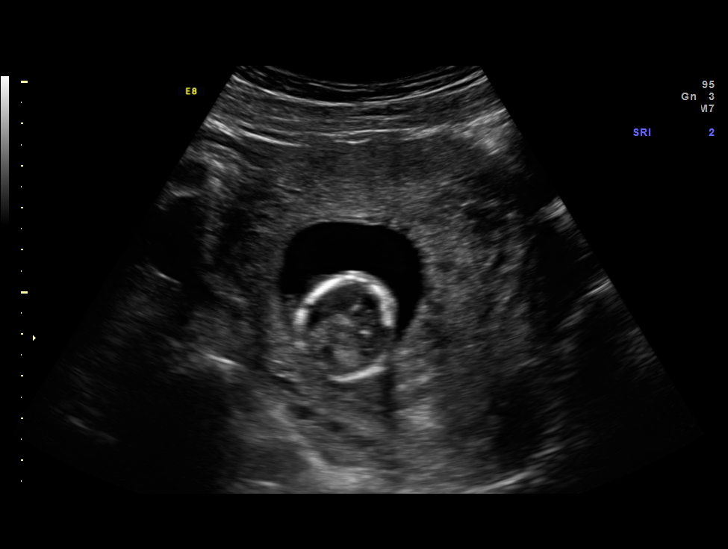
[im 25/45]
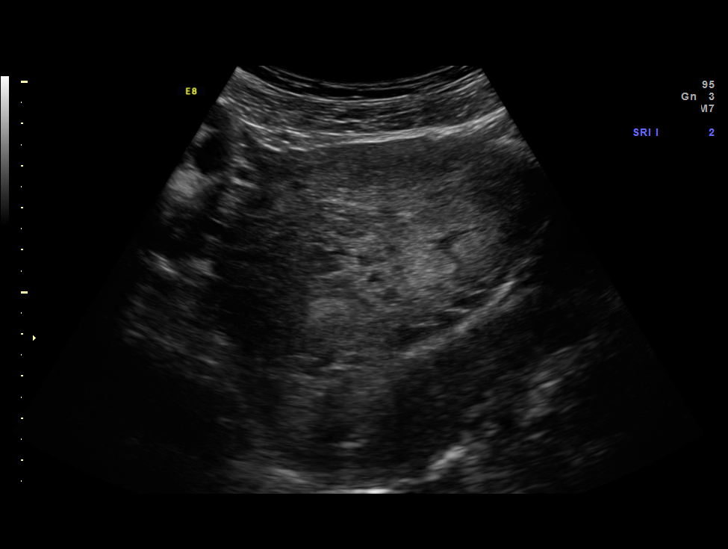
[im 28/45]
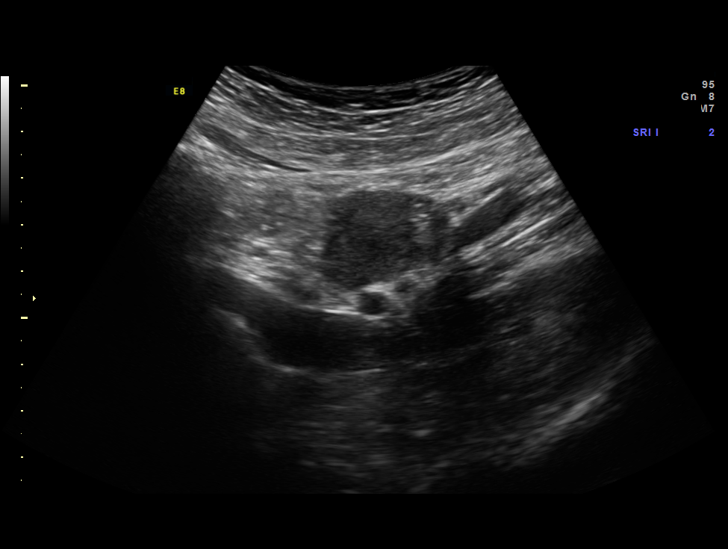
[im 31/45]
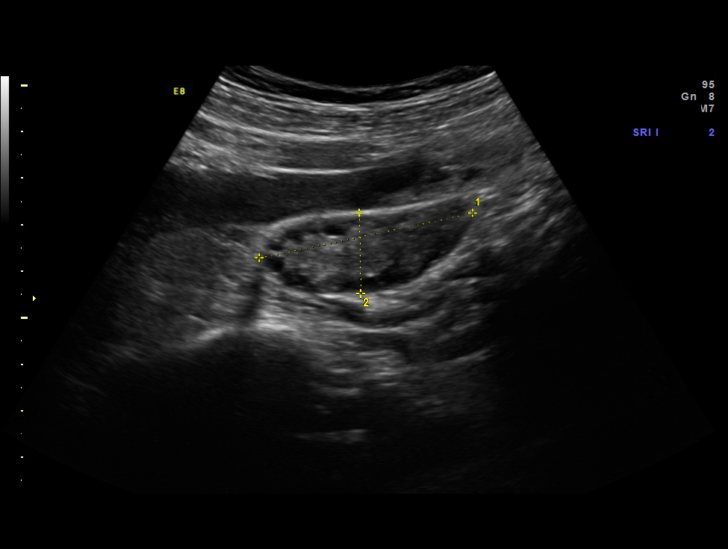
[im 35/45]
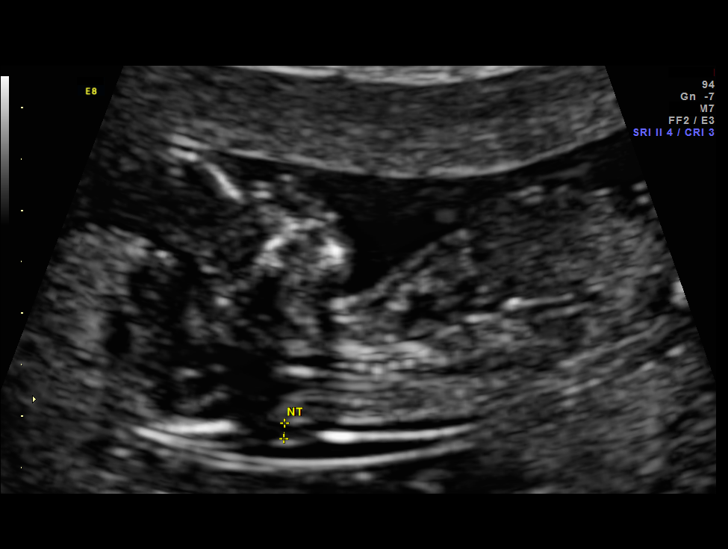
[im 38/45]
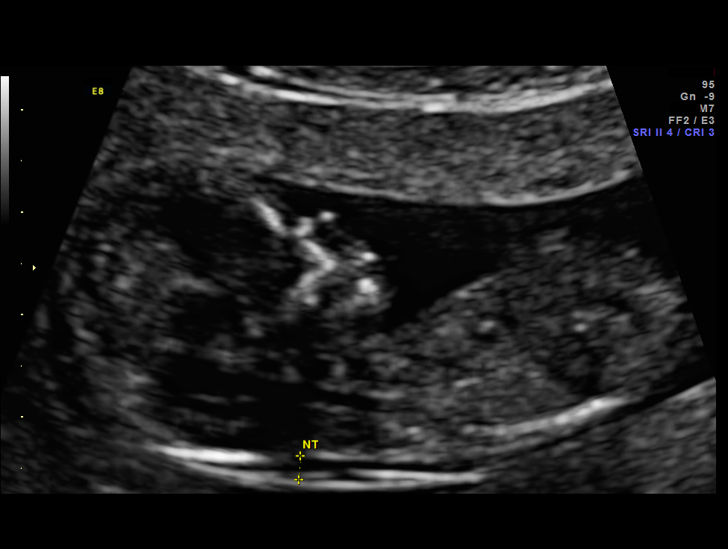
[im 41/45]
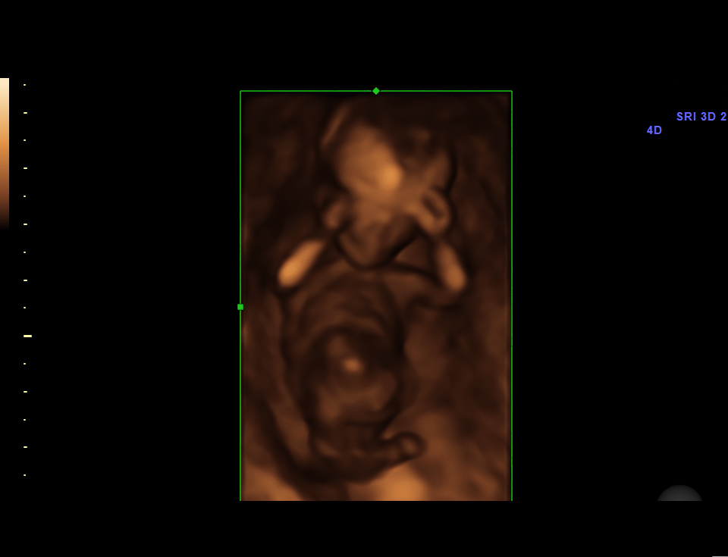
[im 45/45]
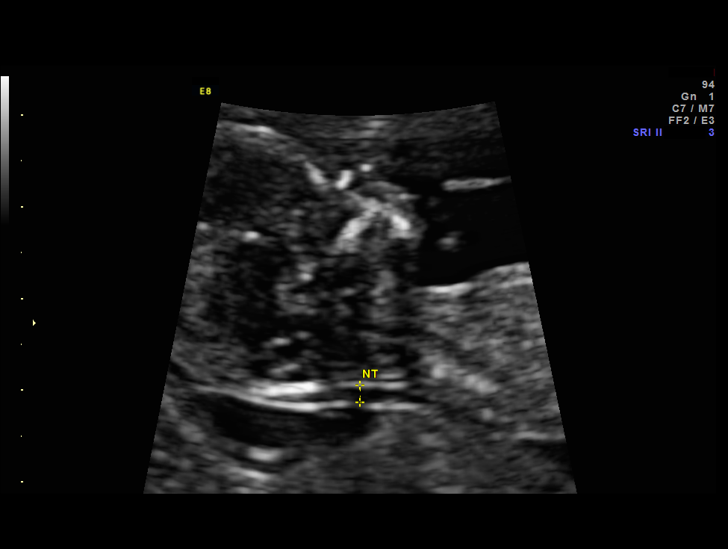

[14 of 28 positions shown; findings below may reference images not displayed]

Canned report from images found in remote index.

Refer to host system for actual result text.

## 2011-03-18 ENCOUNTER — Ambulatory Visit (INDEPENDENT_AMBULATORY_CARE_PROVIDER_SITE_OTHER): Payer: Self-pay | Admitting: Family Medicine

## 2011-03-18 DIAGNOSIS — Z3480 Encounter for supervision of other normal pregnancy, unspecified trimester: Secondary | ICD-10-CM

## 2011-03-18 NOTE — Progress Notes (Signed)
Doing well-AFP, and anatomy u/s ordered, nml First screen.

## 2011-03-18 NOTE — Patient Instructions (Signed)
Pregnancy - Second Trimester The second trimester of pregnancy (3 to 6 months) is a period of rapid growth for you and your baby. At the end of the sixth month, your baby is about 9 inches long and weighs 1 1/2 pounds. You will begin to feel the baby move between 18 and 20 weeks of the pregnancy. This is called quickening. Weight gain is faster. A clear fluid (colostrum) may leak out of your breasts. You may feel small contractions of the womb (uterus). This is known as false labor or Braxton-Hicks contractions. This is like a practice for labor when the baby is ready to be born. Usually, the problems with morning sickness have usually passed by the end of your first trimester. Some women develop small dark blotches (called cholasma, mask of pregnancy) on their face that usually goes away after the baby is born. Exposure to the sun makes the blotches worse. Acne may also develop in some pregnant women and pregnant women who have acne, may find that it goes away. PRENATAL EXAMS  Blood work may continue to be done during prenatal exams. These tests are done to check on your health and the probable health of your baby. Blood work is used to follow your blood levels (hemoglobin). Anemia (low hemoglobin) is common during pregnancy. Iron and vitamins are given to help prevent this. You will also be checked for diabetes between 24 and 28 weeks of the pregnancy. Some of the previous blood tests may be repeated.   The size of the uterus is measured during each visit. This is to make sure that the baby is continuing to grow properly according to the dates of the pregnancy.   Your blood pressure is checked every prenatal visit. This is to make sure you are not getting toxemia.   Your urine is checked to make sure you do not have an infection, diabetes or protein in the urine.   Your weight is checked often to make sure gains are happening at the suggested rate. This is to ensure that both you and your baby are  growing normally.   Sometimes, an ultrasound is performed to confirm the proper growth and development of the baby. This is a test which bounces harmless sound waves off the baby so your caregiver can more accurately determine due dates.  Sometimes, a specialized test is done on the amniotic fluid surrounding the baby. This test is called an amniocentesis. The amniotic fluid is obtained by sticking a needle into the belly (abdomen). This is done to check the chromosomes in instances where there is a concern about possible genetic problems with the baby. It is also sometimes done near the end of pregnancy if an early delivery is required. In this case, it is done to help make sure the baby's lungs are mature enough for the baby to live outside of the womb. CHANGES OCCURING IN THE SECOND TRIMESTER OF PREGNANCY Your body goes through many changes during pregnancy. They vary from person to person. Talk to your caregiver about changes you notice that you are concerned about.  During the second trimester, you will likely have an increase in your appetite. It is normal to have cravings for certain foods. This varies from person to person and pregnancy to pregnancy.   Your lower abdomen will begin to bulge.   You may have to urinate more often because the uterus and baby are pressing on your bladder. It is also common to get more bladder infections during pregnancy (  pain with urination). You can help this by drinking lots of fluids and emptying your bladder before and after intercourse.   You may begin to get stretch marks on your hips, abdomen, and breasts. These are normal changes in the body during pregnancy. There are no exercises or medications to take that prevent this change.   You may begin to develop swollen and bulging veins (varicose veins) in your legs. Wearing support hose, elevating your feet for 15 minutes, 3 to 4 times a day and limiting salt in your diet helps lessen the problem.    Heartburn may develop as the uterus grows and pushes up against the stomach. Antacids recommended by your caregiver helps with this problem. Also, eating smaller meals 4 to 5 times a day helps.   Constipation can be treated with a stool softener or adding bulk to your diet. Drinking lots of fluids, vegetables, fruits, and whole grains are helpful.   Exercising is also helpful. If you have been very active up until your pregnancy, most of these activities can be continued during your pregnancy. If you have been less active, it is helpful to start an exercise program such as walking.   Hemorrhoids (varicose veins in the rectum) may develop at the end of the second trimester. Warm sitz baths and hemorrhoid cream recommended by your caregiver helps hemorrhoid problems.   Backaches may develop during this time of your pregnancy. Avoid heavy lifting, wear low heal shoes and practice good posture to help with backache problems.   Some pregnant women develop tingling and numbness of their hand and fingers because of swelling and tightening of ligaments in the wrist (carpel tunnel syndrome). This goes away after the baby is born.   As your breasts enlarge, you may have to get a bigger bra. Get a comfortable, cotton, support bra. Do not get a nursing bra until the last month of the pregnancy if you will be nursing the baby.   You may get a dark line from your belly button to the pubic area called the linea nigra.   You may develop rosy cheeks because of increase blood flow to the face.   You may develop spider looking lines of the face, neck, arms and chest. These go away after the baby is born.  HOME CARE INSTRUCTIONS   It is extremely important to avoid all smoking, herbs, alcohol, and unprescribed drugs during your pregnancy. These chemicals affect the formation and growth of the baby. Avoid these chemicals throughout the pregnancy to ensure the delivery of a healthy infant.   Most of your home  care instructions are the same as suggested for the first trimester of your pregnancy. Keep your caregiver's appointments. Follow your caregiver's instructions regarding medication use, exercise and diet.   During pregnancy, you are providing food for you and your baby. Continue to eat regular, well-balanced meals. Choose foods such as meat, fish, milk and other low fat dairy products, vegetables, fruits, and whole-grain breads and cereals. Your caregiver will tell you of the ideal weight gain.   A physical sexual relationship may be continued up until near the end of pregnancy if there are no other problems. Problems could include early (premature) leaking of amniotic fluid from the membranes, vaginal bleeding, abdominal pain, or other medical or pregnancy problems.   Exercise regularly if there are no restrictions. Check with your caregiver if you are unsure of the safety of some of your exercises. The greatest weight gain will occur in the   last 2 trimesters of pregnancy. Exercise will help you:   Control your weight.   Get you in shape for labor and delivery.   Lose weight after you have the baby.   Wear a good support or jogging bra for breast tenderness during pregnancy. This may help if worn during sleep. Pads or tissues may be used in the bra if you are leaking colostrum.   Do not use hot tubs, steam rooms or saunas throughout the pregnancy.   Wear your seat belt at all times when driving. This protects you and your baby if you are in an accident.   Avoid raw meat, uncooked cheese, cat litter boxes and soil used by cats. These carry germs that can cause birth defects in the baby.   The second trimester is also a good time to visit your dentist for your dental health if this has not been done yet. Getting your teeth cleaned is OK. Use a soft toothbrush. Brush gently during pregnancy.   It is easier to loose urine during pregnancy. Tightening up and strengthening the pelvic muscles will  help with this problem. Practice stopping your urination while you are going to the bathroom. These are the same muscles you need to strengthen. It is also the muscles you would use as if you were trying to stop from passing gas. You can practice tightening these muscles up 10 times a set and repeating this about 3 times per day. Once you know what muscles to tighten up, do not perform these exercises during urination. It is more likely to contribute to an infection by backing up the urine.   Ask for help if you have financial, counseling or nutritional needs during pregnancy. Your caregiver will be able to offer counseling for these needs as well as refer you for other special needs.   Your skin may become oily. If so, wash your face with mild soap, use non-greasy moisturizer and oil or cream based makeup.  MEDICATIONS AND DRUG USE IN PREGNANCY  Take prenatal vitamins as directed. The vitamin should contain 1 milligram of folic acid. Keep all vitamins out of reach of children. Only a couple vitamins or tablets containing iron may be fatal to a baby or young child when ingested.   Avoid use of all medications, including herbs, over-the-counter medications, not prescribed or suggested by your caregiver. Only take over-the-counter or prescription medicines for pain, discomfort, or fever as directed by your caregiver. Do not use aspirin.   Let your caregiver also know about herbs you may be using.   Alcohol is related to a number of birth defects. This includes fetal alcohol syndrome. All alcohol, in any form, should be avoided completely. Smoking will cause low birth rate and premature babies.   Street or illegal drugs are very harmful to the baby. They are absolutely forbidden. A baby born to an addicted mother will be addicted at birth. The baby will go through the same withdrawal an adult does.  SEEK MEDICAL CARE IF:  You have any concerns or worries during your pregnancy. It is better to call with  your questions if you feel they cannot wait, rather than worry about them. SEEK IMMEDIATE MEDICAL CARE IF:   An unexplained oral temperature above 102 F (38.9 C) develops, or as your caregiver suggests.   You have leaking of fluid from the vagina (birth canal). If leaking membranes are suspected, take your temperature and tell your caregiver of this when you call.   There   is vaginal spotting, bleeding, or passing clots. Tell your caregiver of the amount and how many pads are used. Light spotting in pregnancy is common, especially following intercourse.   You develop a bad smelling vaginal discharge with a change in the color from clear to white.   You continue to feel sick to your stomach (nauseated) and have no relief from remedies suggested. You vomit blood or coffee ground-like materials.   You lose more than 2 pounds of weight or gain more than 2 pounds of weight over 1 week, or as suggested by your caregiver.   You notice swelling of your face, hands, feet, or legs.   You get exposed to German measles and have never had them.   You are exposed to fifth disease or chickenpox.   You develop belly (abdominal) pain. Round ligament discomfort is a common non-cancerous (benign) cause of abdominal pain in pregnancy. Your caregiver still must evaluate you.   You develop a bad headache that does not go away.   You develop fever, diarrhea, pain with urination, or shortness of breath.   You develop visual problems, blurry, or double vision.   You fall or are in a car accident or any kind of trauma.   There is mental or physical violence at home.  Document Released: 04/08/2001 Document Revised: 12/25/2010 Document Reviewed: 10/11/2008 [employer] Patient Information 2012 [employer]. Birth Control Choices Birth control is the use of any practices, methods, or devices to prevent pregnancy from happening in a sexually active woman.  Below are some birth control choices to help avoid  pregnancy.  Not having sex (abstinence) is the surest form of birth control. This requires self-control. There is no risk of acquiring a sexually transmitted disease (STD), including acquired immunodeficiency syndrome (AIDS).   Periodic abstinence requires self-control during certain times of the month.   Calendar method, timing your menstrual periods from month to month.   Ovulation method is avoiding sexual intercourse around the time you produce an egg (ovulate).   Symptotherm method is avoiding sexual intercourse at the time of ovulation, using a thermometer and ovulation symptoms.   Post ovulation method is the timing of sexual intercourse after you ovulated.  These methods do not protect against STDs, including AIDS.  Birth control pills (BCPs) contain estrogen and progesterone hormone. These medicines work by stopping the egg from forming in the ovary (ovulation). Birth control pills are prescribed by a caregiver who will ask you questions about the risks of taking BCPs. Birth control pills do not protect against STDs, including AIDS.   "Minipill" birth control pills have only the progesterone hormone. They are taken every day of each month and must be prescribed by your caregiver. They do not protect against STDs, including AIDS.   Emergency contraception is often call the "morning after" pill. This pill can be taken right after sex or up to five days after sex if you think your birth control failed, you failed to use contraception, or you were forced to have sex. It is most effective the sooner you take the pills after having sexual intercourse. Do not use emergency contraception as your only form of birth control. Emergency contraceptive pills are available without a prescription. Check with your pharmacist.   Condoms are a thin sheath of latex, synthetic material, or lambskin worn over the penis during sexual intercourse. They can have a spermicide in or on them when you buy them.  Latex condoms can prevent pregnancy and STDs. "Natural" or lambskin   condoms can prevent pregnancy but may not protect against STDs, including AIDS.   Female condoms are a soft, loose-fitting sheath that is put into the vagina before sexual intercourse. They can prevent pregnancy and STDs, including AIDS.   Sponge is a soft, circular piece of polyurethane foam with spermicide in it that is inserted into the vagina after wetting it and before sexual intercourse. It does not require a prescription from your caregiver. It does not protect against STDs, including AIDS.   Diaphragm is a soft, latex, dome-shaped barrier that must be fitted by a caregiver. It is inserted into the vagina, along with a spermicidal jelly. After the proper fitting for a diaphragm, always insert the diaphragm before intercourse. The diaphragm should be left in the vagina for 6 to 8 hours after intercourse. Removal and reinsertion with a spermicide is always necessary after any use. It does not protect against STDs, including AIDS.   Progesterone-only injections are given every 3 months to prevent pregnancy. These injections contain synthetic progesterone and no estrogen. This hormone stops the ovaries from releasing eggs. It also causes the cervical mucus to thicken and changes the uterine lining. This makes it harder for sperm to survive in the uterus. It does not protect against STDs, including AIDS.   Birth Control Patch contains hormones similar to those in birth control pills, so effectiveness, risks, and side effects are similar. It must be changed once a week and is prescribed by a caregiver. It is less effective in very overweight women. It does not protect against STDs, including AIDS.   Vaginal Ring contains hormones similar to those in birth control pills. It is left in place for 3 weeks, removed for 1 week, and then a new one is put back into the vagina. It comes with a timer to put in your purse to help you remember when  to take it out or put a new one in. A caregiver's examination and prescription is necessary, just like with birth control pills and the patch. It does not protect against STDs, including AIDS.   Estrogen plus progesterone injections are given every 28 to 30 days. They can be given in the upper arm, thigh, or buttocks. It does not protect against STDs, including AIDS.   Intrauterine device (IUD): copper T or progestin filled is a T-shaped device that is put in a woman's uterus during a menstrual period to prevent pregnancy. The copper T IUD can last 10 years, and the progestin IUD can last 5 years. The progestin IUD can also help control heavy menstrual periods. It does not protect against STDs, including AIDS. The copper T IUD can be used as emergency contraception if inserted within 5 days of having unprotected intercourse.   Cervical cap is a round, soft latex or plastic cup that fits over the cervix and must be fitted by a caregiver. You do not need to use a spermicide with it or remove and insert it every time you have sexual intercourse. It does not protect against STDs, including AIDS.   Spermicides are chemicals that kill or block sperm from entering the cervix and uterus. They come in the form of creams, jellies, suppositories, foam, or tablets, and they do not require a prescription. They are inserted into the vagina with an applicator before having sexual intercourse. This must be repeated every time you have sexual intercourse.   Withdrawal is using the method of the female withdrawing his penis from sexual intercourse before he has a climax   and deposits his sperm. It does not protect against STDs, including AIDS.   Female tubal ligation is when the woman's fallopian tubes are surgically sealed or tied to prevent the egg from traveling to the uterus. It does not protect against STDs, including AIDS.   Female sterilization is when the female has his tubes that carry sperm tied off (vasectomy) to  stop sperm from entering the vagina during sexual intercourse. It does not protect against STDs, including AIDS.  Regardless of which method of birth control you choose, it is still important that you use some form of protection against STDs. Document Released: 04/14/2005 Document Revised: 05/17/2010 Document Reviewed: 03/01/2009 [employer] Patient Information 2012 [employer]. Breastfeeding BENEFITS OF BREASTFEEDING For the baby  The first milk (colostrum) helps the baby's digestive system function better.   There are antibodies from the mother in the milk that help the baby fight off infections.   The baby has a lower incidence of asthma, allergies, and SIDS (sudden infant death syndrome).   The nutrients in breast milk are better than formulas for the baby and helps the baby's brain grow better.   Babies who breastfeed have less gas, colic, and constipation.  For the mother  Breastfeeding helps develop a very special bond between mother and baby.   It is more convenient, always available at the correct temperature and cheaper than formula feeding.   It burns calories in the mother and helps with losing weight that was gained during pregnancy.   It makes the uterus contract back down to normal size faster and slows bleeding following delivery.   Breastfeeding mothers have a lower risk of developing breast cancer.  NURSE FREQUENTLY  A healthy, full-term baby may breastfeed as often as every hour or space his or her feedings to every 3 hours.   How often to nurse will vary from baby to baby. Watch your baby for signs of hunger, not the clock.   Nurse as often as the baby requests, or when you feel the need to reduce the fullness of your breasts.   Awaken the baby if it has been 3 to 4 hours since the last feeding.   Frequent feeding will help the mother make more milk and will prevent problems like sore nipples and engorgement of the breasts.  BABY'S POSITION AT THE  BREAST  Whether lying down or sitting, be sure that the baby's tummy is facing your tummy.   Support the breast with 4 fingers underneath the breast and the thumb above. Make sure your fingers are well away from the nipple and baby's mouth.   Stroke the baby's lips and cheek closest to the breast gently with your finger or nipple.   When the baby's mouth is open wide enough, place all of your nipple and as much of the dark area around the nipple as possible into your baby's mouth.   Pull the baby in close so the tip of the nose and the baby's cheeks touch the breast during the feeding.  FEEDINGS  The length of each feeding varies from baby to baby and from feeding to feeding.   The baby must suck about 2 to 3 minutes for your milk to get to him or her. This is called a "let down." For this reason, allow the baby to feed on each breast as long as he or she wants. Your baby will end the feeding when he or she has received the right balance of nutrients.   To   break the suction, put your finger into the corner of the baby's mouth and slide it between his or her gums before removing your breast from his or her mouth. This will help prevent sore nipples.  REDUCING BREAST ENGORGEMENT  In the first week after your baby is born, you may experience signs of breast engorgement. When breasts are engorged, they feel heavy, warm, full, and may be tender to the touch. You can reduce engorgement if you:   Nurse frequently, every 2 to 3 hours. Mothers who breastfeed early and often have fewer problems with engorgement.   Place light ice packs on your breasts between feedings. This reduces swelling. Wrap the ice packs in a lightweight towel to protect your skin.   Apply moist hot packs to your breast for 5 to 10 minutes before each feeding. This increases circulation and helps the milk flow.   Gently massage your breast before and during the feeding.   Make sure that the baby empties at least one breast  at every feeding before switching sides.   Use a breast pump to empty the breasts if your baby is sleepy or not nursing well. You may also want to pump if you are returning to work or or you feel you are getting engorged.   Avoid bottle feeds, pacifiers or supplemental feedings of water or juice in place of breastfeeding.   Be sure the baby is latched on and positioned properly while breastfeeding.   Prevent fatigue, stress, and anemia.   Wear a supportive bra, avoiding underwire styles.   Eat a balanced diet with enough fluids.  If you follow these suggestions, your engorgement should improve in 24 to 48 hours. If you are still experiencing difficulty, call your lactation consultant or caregiver. IS MY BABY GETTING ENOUGH MILK? Sometimes, mothers worry about whether their babies are getting enough milk. You can be assured that your baby is getting enough milk if:  The baby is actively sucking and you hear swallowing.   The baby nurses at least 8 to 12 times in a 24 hour time period. Nurse your baby until he or she unlatches or falls asleep at the first breast (at least 10 to 20 minutes), then offer the second side.   The baby is wetting 5 to 6 disposable diapers (6 to 8 cloth diapers) in a 24 hour period by 5 to 6 days of age.   The baby is having at least 2 to 3 stools every 24 hours for the first few months. Breast milk is all the food your baby needs. It is not necessary for your baby to have water or formula. In fact, to help your breasts make more milk, it is best not to give your baby supplemental feedings during the early weeks.   The stool should be soft and yellow.   The baby should gain 4 to 7 ounces per week after he is 4 days old.  TAKE CARE OF YOURSELF Take care of your breasts by:  Bathing or showering daily.   Avoiding the use of soaps on your nipples.   Start feedings on your left breast at one feeding and on your right breast at the next feeding.   You will  notice an increase in your milk supply 2 to 5 days after delivery. You may feel some discomfort from engorgement, which makes your breasts very firm and often tender. Engorgement "peaks" out within 24 to 48 hours. In the meantime, apply warm moist towels to your   breasts for 5 to 10 minutes before feeding. Gentle massage and expression of some milk before feeding will soften your breasts, making it easier for your baby to latch on. Wear a well fitting nursing bra and air dry your nipples for 10 to 15 minutes after each feeding.   Only use cotton bra pads.   Only use pure lanolin on your nipples after nursing. You do not need to wash it off before nursing.  Take care of yourself by:   Eating well-balanced meals and nutritious snacks.   Drinking milk, fruit juice, and water to satisfy your thirst (about 8 glasses a day).   Getting plenty of rest.   Increasing calcium in your diet (1200 mg a day).   Avoiding foods that you notice affect the baby in a bad way.  SEEK MEDICAL CARE IF:   You have any questions or difficulty with breastfeeding.   You need help.   You have a hard, red, sore area on your breast, accompanied by a fever of 100.5 F (38.1 C) or more.   Your baby is too sleepy to eat well or is having trouble sleeping.   Your baby is wetting less than 6 diapers per day, by 5 days of age.   Your baby's skin or white part of his or her eyes is more yellow than it was in the hospital.   You feel depressed.  Document Released: 04/14/2005 Document Revised: 12/25/2010 Document Reviewed: 11/27/2008 [employer] Patient Information 2012 [employer]. 

## 2011-03-25 ENCOUNTER — Telehealth: Payer: Self-pay

## 2011-03-25 NOTE — Telephone Encounter (Signed)
[facility] CALLED ABOUT THIS PATIENT THEY NEED TO SPEAK TO YOU ABOUT THIS PATIENTS AFP, PLEASE ASK FOR ADINA HER PHONE NUMBER IS [phone] #2 THE REFERENCE # IS [id number]. THEY CANNOT RUN THE TEST TILL SOMEONE CALLS THEM, THANKS!

## 2011-03-26 ENCOUNTER — Encounter: Payer: Self-pay | Admitting: Family Medicine

## 2011-03-26 LAB — ALPHA FETOPROTEIN, MATERNAL
AFP: 46.6 IU/mL
MoM for AFP: 1.38
Open Spina bifida: NEGATIVE
Osb Risk: 1:3920 {titer}

## 2011-04-01 ENCOUNTER — Ambulatory Visit (HOSPITAL_COMMUNITY): Payer: Self-pay

## 2011-04-02 ENCOUNTER — Encounter: Payer: Self-pay | Admitting: Family Medicine

## 2011-04-02 ENCOUNTER — Ambulatory Visit (HOSPITAL_COMMUNITY)
Admission: RE | Admit: 2011-04-02 | Discharge: 2011-04-02 | Disposition: A | Discharge status: Home / Self Care | Payer: Medicaid Other | Source: Ambulatory Visit | Attending: Family Medicine | Admitting: Family Medicine

## 2011-04-02 DIAGNOSIS — Z3480 Encounter for supervision of other normal pregnancy, unspecified trimester: Secondary | ICD-10-CM

## 2011-04-02 DIAGNOSIS — Z1389 Encounter for screening for other disorder: Secondary | ICD-10-CM | POA: Insufficient documentation

## 2011-04-02 DIAGNOSIS — O358XX0 Maternal care for other (suspected) fetal abnormality and damage, not applicable or unspecified: Secondary | ICD-10-CM | POA: Insufficient documentation

## 2011-04-02 DIAGNOSIS — Z363 Encounter for antenatal screening for malformations: Secondary | ICD-10-CM | POA: Insufficient documentation

## 2011-04-02 IMAGING — US US OB DETAIL+14 WK
1 series · 16 of 28 positions shown · non-contrast
Comparison: none

[Series 1: us ob detail +14 wk · 97 acquisitions, 16 frames shown]
[im 1/97]
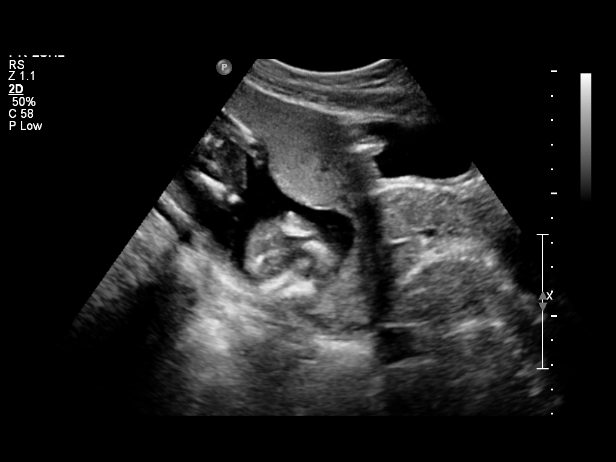
[im 8/97]
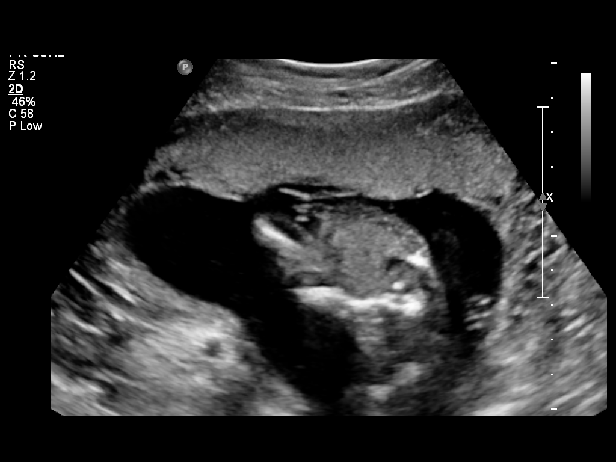
[im 15/97]
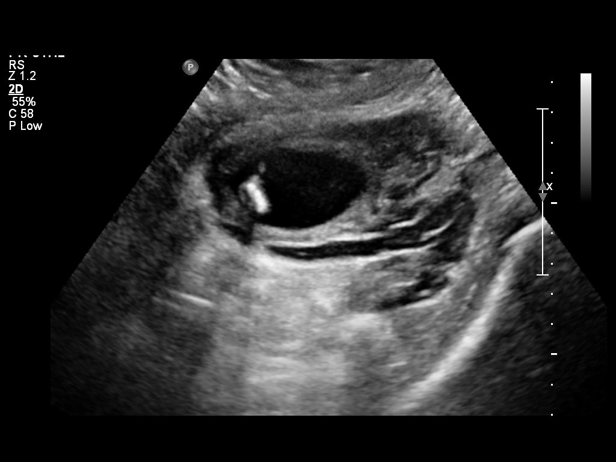
[im 22/97]
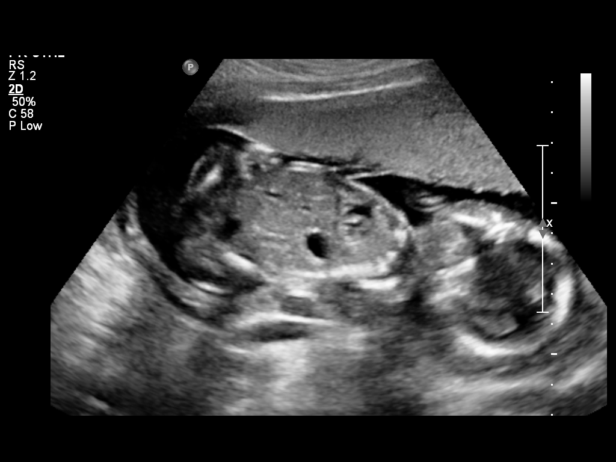
[im 25/97]
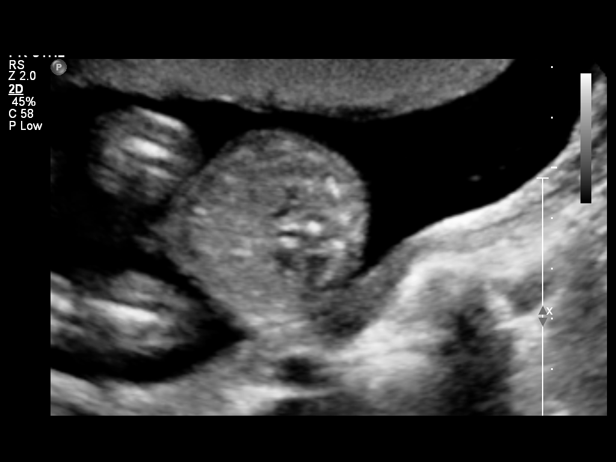
[im 33/97]
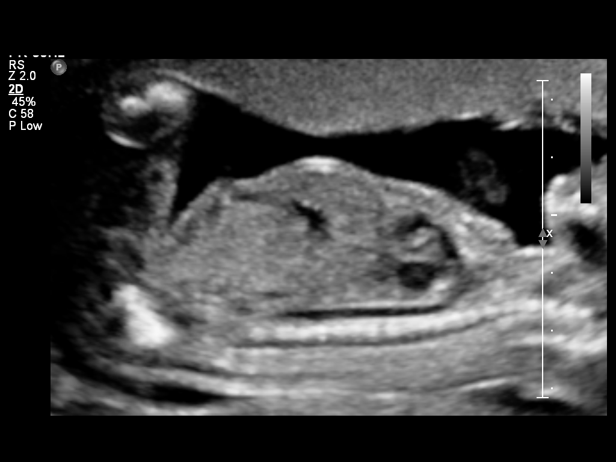
[im 40/97]
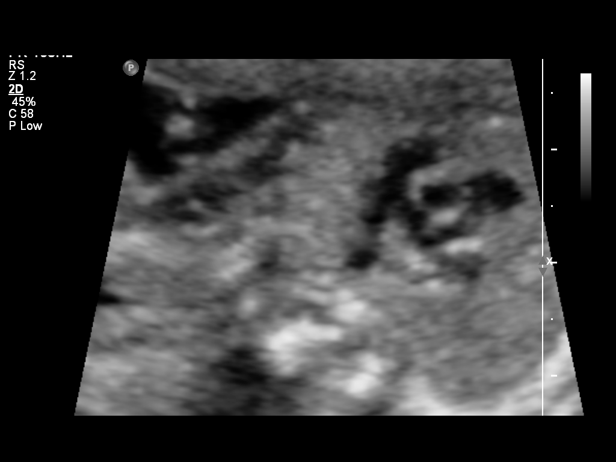
[im 47/97]
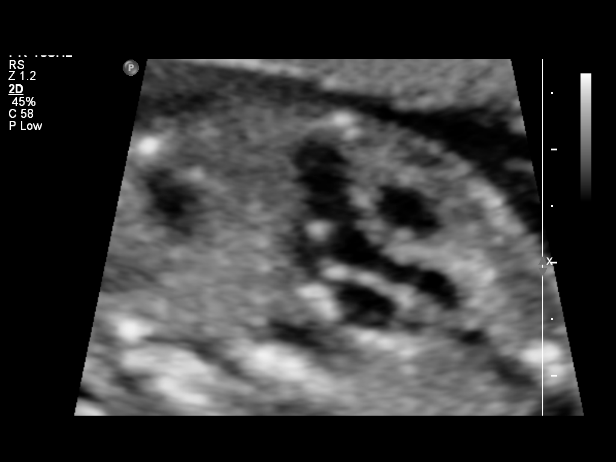
[im 50/97]
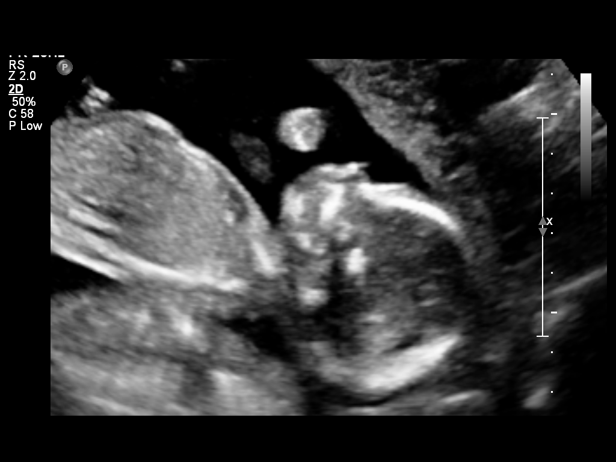
[im 57/97]
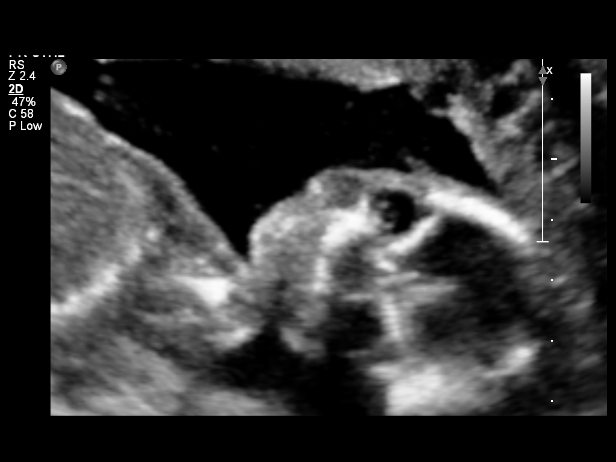
[im 65/97]
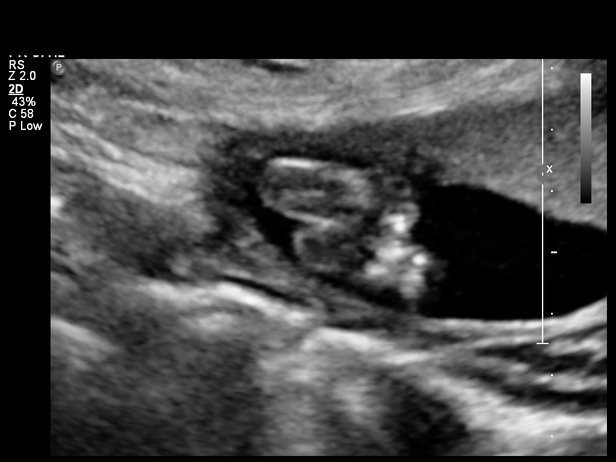
[im 72/97]
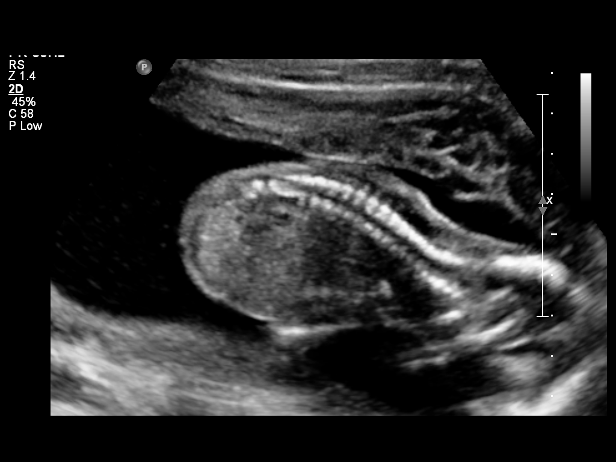
[im 75/97]
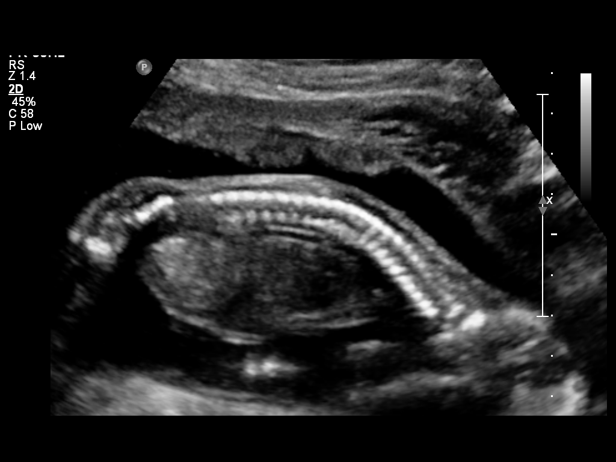
[im 82/97]
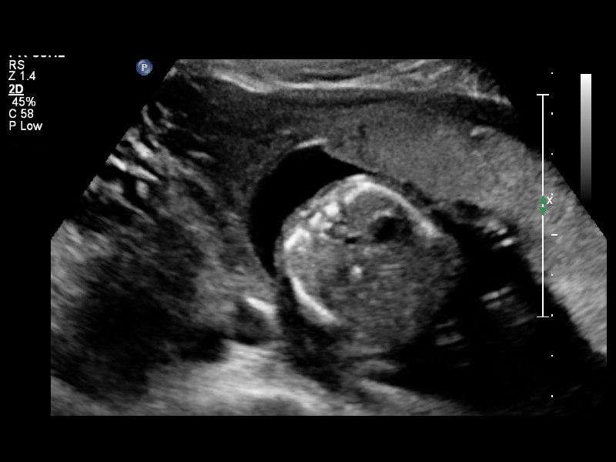
[im 89/97]
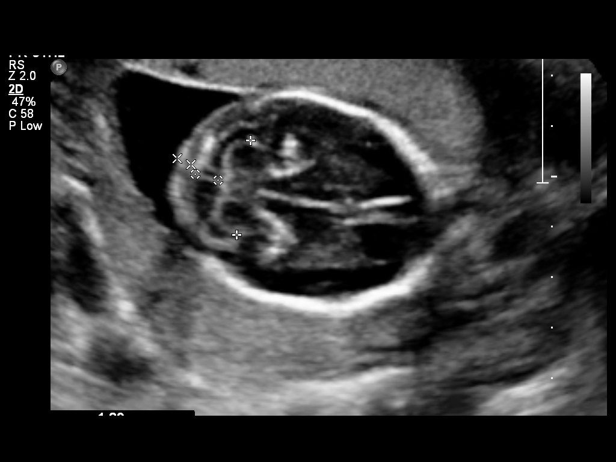
[im 97/97]
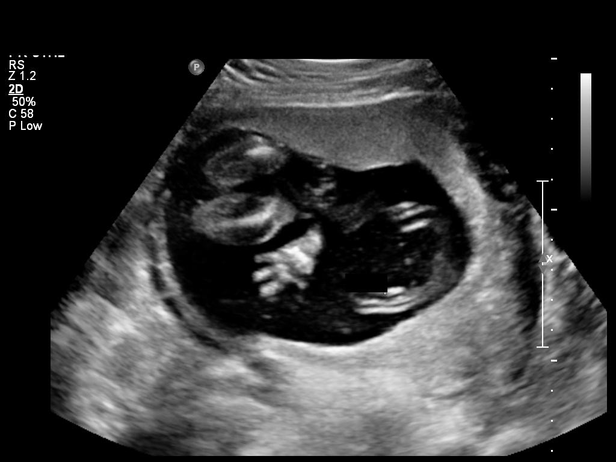

[16 of 28 positions shown; findings below may reference images not displayed]

OBSTETRICS REPORT
                      (Signed Final 04/02/2011 [DATE])

 Order#:         [id number]
Procedures

 US OB DETAIL + 14 WK                                  76811.0
Indications

 Detailed fetal anatomic survey
Fetal Evaluation

 Fetal Heart Rate:  143                         bpm
 Cardiac Activity:  Observed
 Presentation:      Cephalic
 Placenta:          Anterior, above cervical os
 P. Cord            Visualized
 Insertion:

 Amniotic Fluid
 AFI FV:      Subjectively within normal limits
                                             Larg Pckt:     4.9  cm
Biometry

 BPD:     40.3  mm    G. Age:   18w 1d                CI:         74.5   70 - 86
 OFD:     54.1  mm                                    FL/HC:      17.2   16.1 -

 HC:     155.1  mm    G. Age:   18w 3d       30  %    HC/AC:      1.19   1.09 -

 AC:     130.6  mm    G. Age:   18w 4d       43  %    FL/BPD:
 FL:      26.7  mm    G. Age:   18w 1d       24  %    FL/AC:      20.4   20 - 24
 HUM:     25.7  mm    G. Age:   18w 0d       34  %
 CER:     18.9  mm    G. Age:   18w 3d       41  %
 NFT:     2.99  mm

 Est. FW:     237  gm      0 lb 8 oz     39  %
Gestational Age

 LMP:           18w 2d       Date:   11/25/10                 EDD:   09/01/11
 U/S Today:     18w 2d                                        EDD:   09/01/11
 Best:          18w 5d    Det. By:   U/S C R L (02/12/11)     EDD:   08/29/11
Genetic Sonogram - Trisomy 21 Screening
 Age:                                             25          Risk=1:   885
 Echogenic bowel:                                 No          LR :
 Hypoplastic/absent Nasal bone:                   No
 Choroid plexus cysts:                            No
 Structural anomalies (inc. cardiac):             No          LR :
 Hypoplastic / absent midphalanx 5th Digit:       No
 Short femur:                                     No          LR :
 Short humerus:                                   No          LR :
 2-vessel umbilical cord:                         No
 Pyelectasis:                                     No          LR :
 Echogenic cardiac foci:                          Yes         LR :
 Nuchal fold thickening >= 6 mm:                  No          LR :

 11 Of 11 Criteria Were Visualized and 1 Abnormal(s) Were Seen.
 Ultrasound Modified Risk for Fetal Down Syndrome = [DATE]
Anatomy

 Cranium:           Appears normal      Aortic Arch:       Appears normal
 Fetal Cavum:       Appears normal      Ductal Arch:       Appears normal
 Ventricles:        Appears normal      Diaphragm:         Appears normal
 Choroid Plexus:    Appears normal      Stomach:           Appears
                                                           normal, left
                                                           sided
 Cerebellum:        Appears normal      Abdomen:           Appears normal
 Posterior Fossa:   Appears normal      Abdominal Wall:    Appears nml
                                                           (cord insert,
                                                           abd wall)
 Nuchal Fold:       Appears normal      Cord Vessels:      Appears normal
                    (neck, nuchal                          (3 vessel cord)
                    fold)
 Face:              Appears normal      Kidneys:           Appear normal
                    (lips/profile/orbit
                    s)
 Heart:             Echogenic           Bladder:           Appears normal
                    focus in LV
 RVOT:              Appears normal      Spine:             Appears normal
 LVOT:              Appears normal      Limbs:             Appears normal
                                                           (hands, ankles,
                                                           feet)

 Other:     Fetus appears to be a male. Heels and 5th digit
            visualized. Nasal bone visualized.  As an isolated finding,
            EIF mildly increases pre-test risk for Trisomy 21.
Cervix Uterus Adnexa

 Cervical Length:   3.6       cm

 Cervix:       Normal appearance by transabdominal scan.
 Left Ovary:   Within normal limits.
 Right Ovary:  Within normal limits.

 Adnexa:     No abnormality visualized.
Impression

 Assigned GA is currently 18w 5d by early US.  Appropriate
 interval fetal growth.
 No fetal anomalies seen involving visualized anatomy.
 Echogenic intracardiac focus noted, which is a soft marker for
 Trisomy 21;  see above for US modified risk calculation.
Recommendations

 Correlation with 1st or 2nd trimester aneuploidy screening
 results, if performed. If further genetic testing or counseling is
 desired, this could be scheduled at the [HOSPITAL]

## 2011-04-15 ENCOUNTER — Encounter: Payer: Self-pay | Admitting: Advanced Practice Midwife

## 2011-04-15 ENCOUNTER — Ambulatory Visit (INDEPENDENT_AMBULATORY_CARE_PROVIDER_SITE_OTHER): Payer: Medicaid Other | Admitting: Advanced Practice Midwife

## 2011-04-15 VITALS — BP 100/55 | Wt 148.0 lb

## 2011-04-15 DIAGNOSIS — O35BXX0 Maternal care for other (suspected) fetal abnormality and damage, fetal cardiac anomalies, not applicable or unspecified: Secondary | ICD-10-CM

## 2011-04-15 DIAGNOSIS — Z3480 Encounter for supervision of other normal pregnancy, unspecified trimester: Secondary | ICD-10-CM

## 2011-04-15 DIAGNOSIS — Z3490 Encounter for supervision of normal pregnancy, unspecified, unspecified trimester: Secondary | ICD-10-CM

## 2011-04-15 DIAGNOSIS — O358XX0 Maternal care for other (suspected) fetal abnormality and damage, not applicable or unspecified: Secondary | ICD-10-CM

## 2011-04-15 NOTE — Progress Notes (Signed)
[place] reviewed:  Anatomy and dates WNL, EIF seen in L Ventricle, Placenta Anterior, Cx 3.6cm.  Discussed that other soft markers for T21 were negative. Per Radiologist, EIF is only soft marker seen. First screen normal. May want repeat US in future to eval EIF.

## 2011-04-15 NOTE — Patient Instructions (Signed)
Pregnancy - Second Trimester The second trimester of pregnancy (3 to 6 months) is a period of rapid growth for you and your baby. At the end of the sixth month, your baby is about 9 inches long and weighs 1 1/2 pounds. You will begin to feel the baby move between 18 and 20 weeks of the pregnancy. This is called quickening. Weight gain is faster. A clear fluid (colostrum) may leak out of your breasts. You may feel small contractions of the womb (uterus). This is known as false labor or Braxton-Hicks contractions. This is like a practice for labor when the baby is ready to be born. Usually, the problems with morning sickness have usually passed by the end of your first trimester. Some women develop small dark blotches (called cholasma, mask of pregnancy) on their face that usually goes away after the baby is born. Exposure to the sun makes the blotches worse. Acne may also develop in some pregnant women and pregnant women who have acne, may find that it goes away. PRENATAL EXAMS  Blood work may continue to be done during prenatal exams. These tests are done to check on your health and the probable health of your baby. Blood work is used to follow your blood levels (hemoglobin). Anemia (low hemoglobin) is common during pregnancy. Iron and vitamins are given to help prevent this. You will also be checked for diabetes between 24 and 28 weeks of the pregnancy. Some of the previous blood tests may be repeated.   The size of the uterus is measured during each visit. This is to make sure that the baby is continuing to grow properly according to the dates of the pregnancy.   Your blood pressure is checked every prenatal visit. This is to make sure you are not getting toxemia.   Your urine is checked to make sure you do not have an infection, diabetes or protein in the urine.   Your weight is checked often to make sure gains are happening at the suggested rate. This is to ensure that both you and your baby are  growing normally.   Sometimes, an ultrasound is performed to confirm the proper growth and development of the baby. This is a test which bounces harmless sound waves off the baby so your caregiver can more accurately determine due dates.  Sometimes, a specialized test is done on the amniotic fluid surrounding the baby. This test is called an amniocentesis. The amniotic fluid is obtained by sticking a needle into the belly (abdomen). This is done to check the chromosomes in instances where there is a concern about possible genetic problems with the baby. It is also sometimes done near the end of pregnancy if an early delivery is required. In this case, it is done to help make sure the baby's lungs are mature enough for the baby to live outside of the womb. CHANGES OCCURING IN THE SECOND TRIMESTER OF PREGNANCY Your body goes through many changes during pregnancy. They vary from person to person. Talk to your caregiver about changes you notice that you are concerned about.  During the second trimester, you will likely have an increase in your appetite. It is normal to have cravings for certain foods. This varies from person to person and pregnancy to pregnancy.   Your lower abdomen will begin to bulge.   You may have to urinate more often because the uterus and baby are pressing on your bladder. It is also common to get more bladder infections during pregnancy (  pain with urination). You can help this by drinking lots of fluids and emptying your bladder before and after intercourse.   You may begin to get stretch marks on your hips, abdomen, and breasts. These are normal changes in the body during pregnancy. There are no exercises or medications to take that prevent this change.   You may begin to develop swollen and bulging veins (varicose veins) in your legs. Wearing support hose, elevating your feet for 15 minutes, 3 to 4 times a day and limiting salt in your diet helps lessen the problem.    Heartburn may develop as the uterus grows and pushes up against the stomach. Antacids recommended by your caregiver helps with this problem. Also, eating smaller meals 4 to 5 times a day helps.   Constipation can be treated with a stool softener or adding bulk to your diet. Drinking lots of fluids, vegetables, fruits, and whole grains are helpful.   Exercising is also helpful. If you have been very active up until your pregnancy, most of these activities can be continued during your pregnancy. If you have been less active, it is helpful to start an exercise program such as walking.   Hemorrhoids (varicose veins in the rectum) may develop at the end of the second trimester. Warm sitz baths and hemorrhoid cream recommended by your caregiver helps hemorrhoid problems.   Backaches may develop during this time of your pregnancy. Avoid heavy lifting, wear low heal shoes and practice good posture to help with backache problems.   Some pregnant women develop tingling and numbness of their hand and fingers because of swelling and tightening of ligaments in the wrist (carpel tunnel syndrome). This goes away after the baby is born.   As your breasts enlarge, you may have to get a bigger bra. Get a comfortable, cotton, support bra. Do not get a nursing bra until the last month of the pregnancy if you will be nursing the baby.   You may get a dark line from your belly button to the pubic area called the linea nigra.   You may develop rosy cheeks because of increase blood flow to the face.   You may develop spider looking lines of the face, neck, arms and chest. These go away after the baby is born.  HOME CARE INSTRUCTIONS   It is extremely important to avoid all smoking, herbs, alcohol, and unprescribed drugs during your pregnancy. These chemicals affect the formation and growth of the baby. Avoid these chemicals throughout the pregnancy to ensure the delivery of a healthy infant.   Most of your home  care instructions are the same as suggested for the first trimester of your pregnancy. Keep your caregiver's appointments. Follow your caregiver's instructions regarding medication use, exercise and diet.   During pregnancy, you are providing food for you and your baby. Continue to eat regular, well-balanced meals. Choose foods such as meat, fish, milk and other low fat dairy products, vegetables, fruits, and whole-grain breads and cereals. Your caregiver will tell you of the ideal weight gain.   A physical sexual relationship may be continued up until near the end of pregnancy if there are no other problems. Problems could include early (premature) leaking of amniotic fluid from the membranes, vaginal bleeding, abdominal pain, or other medical or pregnancy problems.   Exercise regularly if there are no restrictions. Check with your caregiver if you are unsure of the safety of some of your exercises. The greatest weight gain will occur in the   last 2 trimesters of pregnancy. Exercise will help you:   Control your weight.   Get you in shape for labor and delivery.   Lose weight after you have the baby.   Wear a good support or jogging bra for breast tenderness during pregnancy. This may help if worn during sleep. Pads or tissues may be used in the bra if you are leaking colostrum.   Do not use hot tubs, steam rooms or saunas throughout the pregnancy.   Wear your seat belt at all times when driving. This protects you and your baby if you are in an accident.   Avoid raw meat, uncooked cheese, cat litter boxes and soil used by cats. These carry germs that can cause birth defects in the baby.   The second trimester is also a good time to visit your dentist for your dental health if this has not been done yet. Getting your teeth cleaned is OK. Use a soft toothbrush. Brush gently during pregnancy.   It is easier to loose urine during pregnancy. Tightening up and strengthening the pelvic muscles will  help with this problem. Practice stopping your urination while you are going to the bathroom. These are the same muscles you need to strengthen. It is also the muscles you would use as if you were trying to stop from passing gas. You can practice tightening these muscles up 10 times a set and repeating this about 3 times per day. Once you know what muscles to tighten up, do not perform these exercises during urination. It is more likely to contribute to an infection by backing up the urine.   Ask for help if you have financial, counseling or nutritional needs during pregnancy. Your caregiver will be able to offer counseling for these needs as well as refer you for other special needs.   Your skin may become oily. If so, wash your face with mild soap, use non-greasy moisturizer and oil or cream based makeup.  MEDICATIONS AND DRUG USE IN PREGNANCY  Take prenatal vitamins as directed. The vitamin should contain 1 milligram of folic acid. Keep all vitamins out of reach of children. Only a couple vitamins or tablets containing iron may be fatal to a baby or young child when ingested.   Avoid use of all medications, including herbs, over-the-counter medications, not prescribed or suggested by your caregiver. Only take over-the-counter or prescription medicines for pain, discomfort, or fever as directed by your caregiver. Do not use aspirin.   Let your caregiver also know about herbs you may be using.   Alcohol is related to a number of birth defects. This includes fetal alcohol syndrome. All alcohol, in any form, should be avoided completely. Smoking will cause low birth rate and premature babies.   Street or illegal drugs are very harmful to the baby. They are absolutely forbidden. A baby born to an addicted mother will be addicted at birth. The baby will go through the same withdrawal an adult does.  SEEK MEDICAL CARE IF:  You have any concerns or worries during your pregnancy. It is better to call with  your questions if you feel they cannot wait, rather than worry about them. SEEK IMMEDIATE MEDICAL CARE IF:   An unexplained oral temperature above 102 F (38.9 C) develops, or as your caregiver suggests.   You have leaking of fluid from the vagina (birth canal). If leaking membranes are suspected, take your temperature and tell your caregiver of this when you call.   There   is vaginal spotting, bleeding, or passing clots. Tell your caregiver of the amount and how many pads are used. Light spotting in pregnancy is common, especially following intercourse.   You develop a bad smelling vaginal discharge with a change in the color from clear to white.   You continue to feel sick to your stomach (nauseated) and have no relief from remedies suggested. You vomit blood or coffee ground-like materials.   You lose more than 2 pounds of weight or gain more than 2 pounds of weight over 1 week, or as suggested by your caregiver.   You notice swelling of your face, hands, feet, or legs.   You get exposed to German measles and have never had them.   You are exposed to fifth disease or chickenpox.   You develop belly (abdominal) pain. Round ligament discomfort is a common non-cancerous (benign) cause of abdominal pain in pregnancy. Your caregiver still must evaluate you.   You develop a bad headache that does not go away.   You develop fever, diarrhea, pain with urination, or shortness of breath.   You develop visual problems, blurry, or double vision.   You fall or are in a car accident or any kind of trauma.   There is mental or physical violence at home.  Document Released: 04/08/2001 Document Revised: 12/25/2010 Document Reviewed: 10/11/2008 [employer] Patient Information 2012 [employer]. 

## 2011-04-18 ENCOUNTER — Encounter (HOSPITAL_COMMUNITY): Payer: Self-pay

## 2011-04-18 ENCOUNTER — Inpatient Hospital Stay (HOSPITAL_COMMUNITY)
Admission: AD | Admit: 2011-04-18 | Discharge: 2011-04-18 | Disposition: A | Discharge status: Home / Self Care | Payer: Medicaid Other | Source: Ambulatory Visit | Attending: Obstetrics and Gynecology | Admitting: Obstetrics and Gynecology

## 2011-04-18 DIAGNOSIS — Z0371 Encounter for suspected problem with amniotic cavity and membrane ruled out: Secondary | ICD-10-CM

## 2011-04-18 DIAGNOSIS — O99891 Other specified diseases and conditions complicating pregnancy: Secondary | ICD-10-CM | POA: Insufficient documentation

## 2011-04-18 DIAGNOSIS — Z3480 Encounter for supervision of other normal pregnancy, unspecified trimester: Secondary | ICD-10-CM

## 2011-04-18 HISTORY — DX: Unspecified abnormal cytological findings in specimens from cervix uteri: R87.619

## 2011-04-18 HISTORY — DX: Reserved for concepts with insufficient information to code with codable children: IMO0002

## 2011-04-18 LAB — URINALYSIS, ROUTINE W REFLEX MICROSCOPIC
Leukocytes, UA: NEGATIVE
Nitrite: NEGATIVE
Urobilinogen, UA: 0.2 mg/dL (ref 0.0–1.0)

## 2011-04-18 LAB — URINE MICROSCOPIC-ADD ON

## 2011-04-18 NOTE — Progress Notes (Signed)
Patient stated she noticed clear thin fluid "dripping" from vagina at 1500 today that lasted about an hour. Denies vaginal bleeding. Denies contractions/cramping.

## 2011-04-18 NOTE — ED Provider Notes (Signed)
Joan Warmuth Phillips25 y.o.G1P0000 @[redacted]w[redacted]d   SUBJECTIVE  HPI: Presents with history of spurt of wetness at 1500 today and noticed small leak of fluid for about 1/2 hr. Not sure if fluid from vagina or urine. No gush or need for pad, just wetness on underwear. No UCs or abd pain but had some crampiness yesterday. No antecedent intercourse or irritative vaginitis. No dysuria, frequency, urgency. Good FM.    Past Medical History  Diagnosis Date  . Anxiety     at younger age. was on lexapro  . Infection     bladder, yeast, uri.  . Asthma     childhood  . Abnormal Pap smear     repeat WNL   Past Surgical History  Procedure Date  . Wisdom tooth extraction   . Tooth extraction    History   Social History  . Marital Status: Married    Spouse Name: N/A    Number of Children: N/A  . Years of Education: N/A   Occupational History  . Not on file.   Social History Main Topics  . Smoking status: Former Smoker    Types: Cigarettes    Quit date: 10/15/2004  . Smokeless tobacco: Not on file  . Alcohol Use: No     social  . Drug Use: No     not for many years  . Sexually Active: Yes   Other Topics Concern  . Not on file   Social History Narrative  . No narrative on file   No current facility-administered medications on file prior to encounter.   Current Outpatient Prescriptions on File Prior to Encounter  Medication Sig Dispense Refill  . Prenatal Vit-Fe Fumarate-FA (MULTIVITAMIN-PRENATAL) 27-0.8 MG TABS Take 1 tablet by mouth daily.         Allergies  Allergen Reactions  . Cefdinir (Omnicef) Rash    ROS: Pertinent items in HPI  OBJECTIVE  BP 114/67  Pulse 62  Temp(Src) 98 F (36.7 C) (Oral)  Resp 18  Ht 5\' 3"  (1.6 m)  Wt 67.223 kg (148 lb 3.2 oz)  BMI 26.25 kg/m2  SpO2 98%  LMP 11/25/2010  Physical Exam  Constitutional: She is well-developed, well-nourished, and in no distress.  HENT:  Head: Normocephalic.  Neck: Neck supple.  Cardiovascular: Normal rate.    Pulmonary/Chest: Effort normal.  Abdominal: Soft. There is no tenderness.  Genitourinary: No vaginal discharge found.       SSE: neg pool, scant white creamy discharge, neg fern  Cx looks long closed  Musculoskeletal: Normal range of motion.  Neurological: She is alert.  Skin: Skin is warm and dry.  Psychiatric: Affect normal.   Results for orders placed during the hospital encounter of 04/18/11 (from the past 24 hour(s))  URINALYSIS, ROUTINE W REFLEX MICROSCOPIC     Status: Abnormal   Collection Time   04/18/11  5:27 PM      Component Value Range   Color, Urine STRAW (*) YELLOW    APPearance CLEAR  CLEAR    Specific Gravity, Urine 1.010  1.005 - 1.030    pH 6.0  5.0 - 8.0    Glucose, UA NEGATIVE  NEGATIVE (mg/dL)   Hgb urine dipstick TRACE (*) NEGATIVE    Bilirubin Urine NEGATIVE  NEGATIVE    Ketones, ur NEGATIVE  NEGATIVE (mg/dL)   Protein, ur NEGATIVE  NEGATIVE (mg/dL)   Urobilinogen, UA 0.2  0.0 - 1.0 (mg/dL)   Nitrite NEGATIVE  NEGATIVE    Leukocytes, UA NEGATIVE  NEGATIVE  URINE MICROSCOPIC-ADD ON     Status: Abnormal   Collection Time   04/18/11  5:27 PM      Component Value Range   Squamous Epithelial / LPF FEW (*) RARE    RBC / HPF 0-2  <3 (RBC/hpf)  FHR DT 145 Toco: No UCs  ASSESSMENT  G1 at 21 weeks with no evidence SROM or UTI.   PLAN Reassured

## 2011-04-19 NOTE — ED Provider Notes (Signed)
Attestation of Attending Supervision of Advanced Practitioner: Evaluation and management procedures were performed by the PA/NP/CNM/OB Fellow under my supervision/collaboration. Chart reviewed and agree with management and plan.  Tilda Burrow 04/19/2011 6:33 AM

## 2011-04-28 ENCOUNTER — Inpatient Hospital Stay (HOSPITAL_COMMUNITY): Payer: Medicaid Other

## 2011-04-28 ENCOUNTER — Encounter (HOSPITAL_COMMUNITY): Payer: Self-pay | Admitting: *Deleted

## 2011-04-28 ENCOUNTER — Inpatient Hospital Stay (HOSPITAL_COMMUNITY)
Admission: AD | Admit: 2011-04-28 | Discharge: 2011-04-28 | Disposition: A | Discharge status: Home / Self Care | Payer: Medicaid Other | Source: Ambulatory Visit | Attending: Obstetrics & Gynecology | Admitting: Obstetrics & Gynecology

## 2011-04-28 DIAGNOSIS — Z0000 Encounter for general adult medical examination without abnormal findings: Secondary | ICD-10-CM

## 2011-04-28 DIAGNOSIS — O99891 Other specified diseases and conditions complicating pregnancy: Secondary | ICD-10-CM | POA: Insufficient documentation

## 2011-04-28 LAB — URINALYSIS, ROUTINE W REFLEX MICROSCOPIC
Hgb urine dipstick: NEGATIVE
Leukocytes, UA: NEGATIVE
Nitrite: NEGATIVE
Protein, ur: NEGATIVE mg/dL
Urobilinogen, UA: 0.2 mg/dL (ref 0.0–1.0)

## 2011-04-28 IMAGING — US US OB LIMITED
1 series · 10 of 10 positions shown · non-contrast
Comparison: none

[Series 1: us ob transvaginal · 10 of 10 slices shown]
[im 1/10]
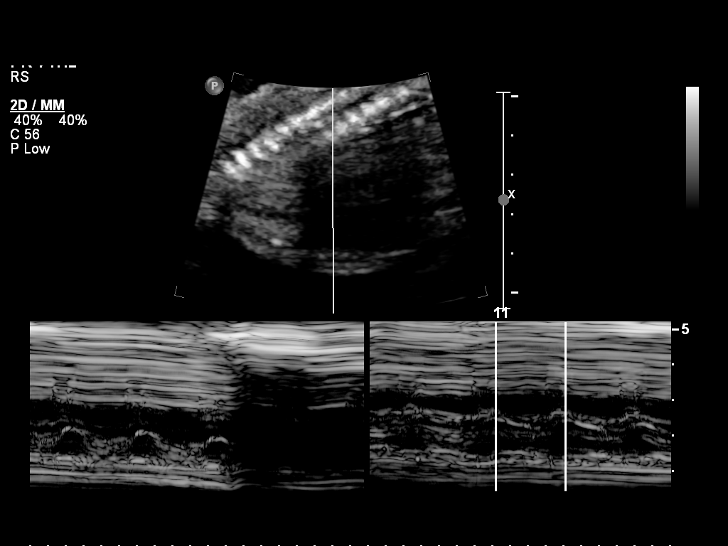
[im 2/10]
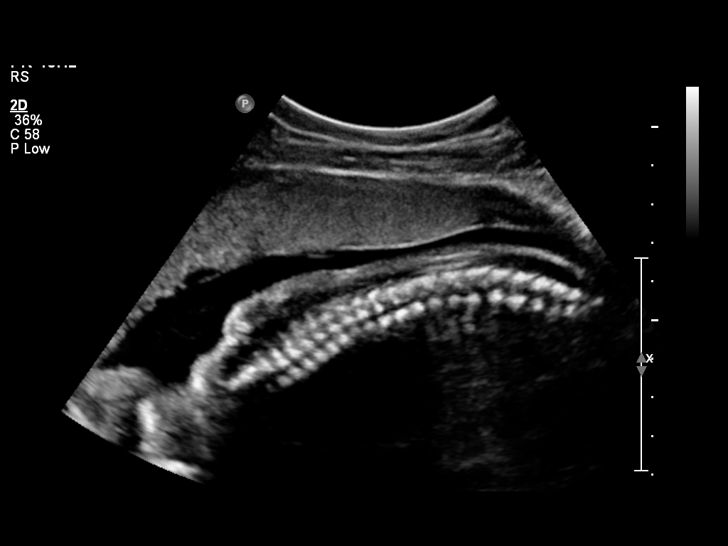
[im 3/10]
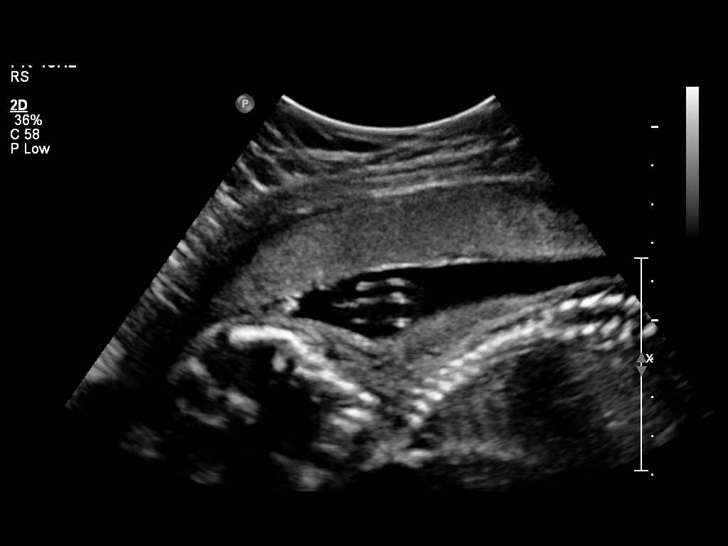
[im 4/10]
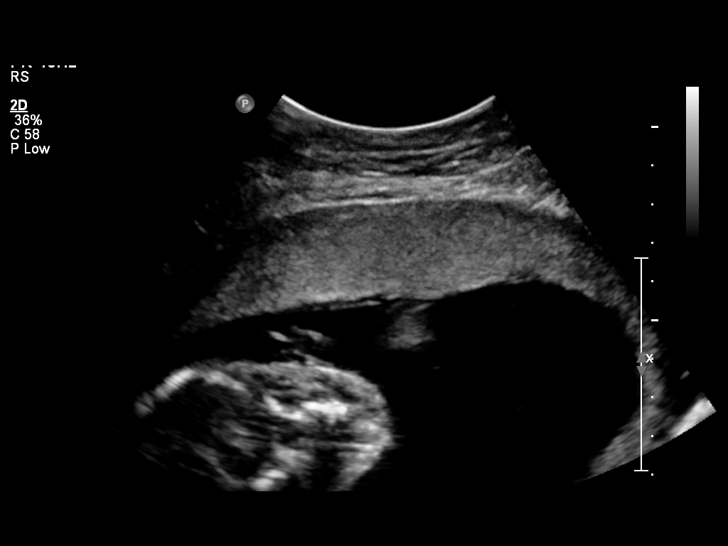
[im 5/10]
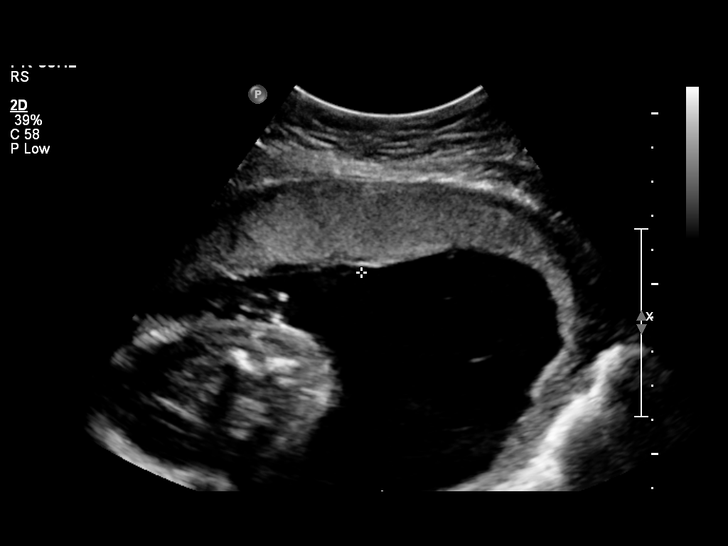
[im 6/10]
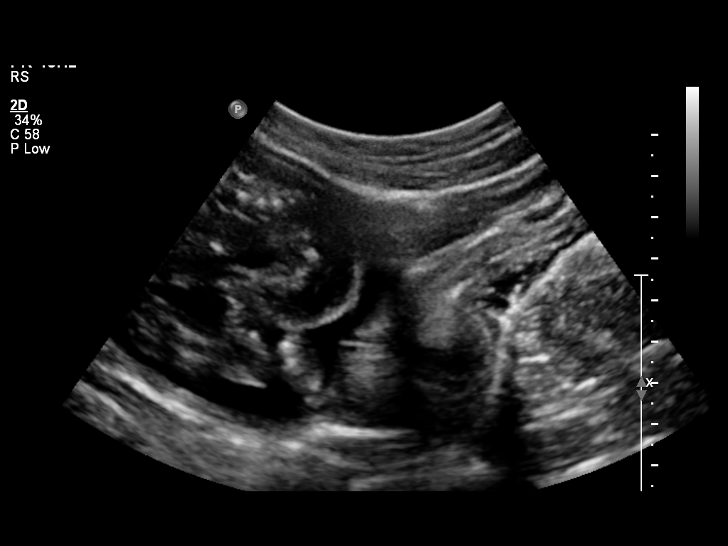
[im 7/10]
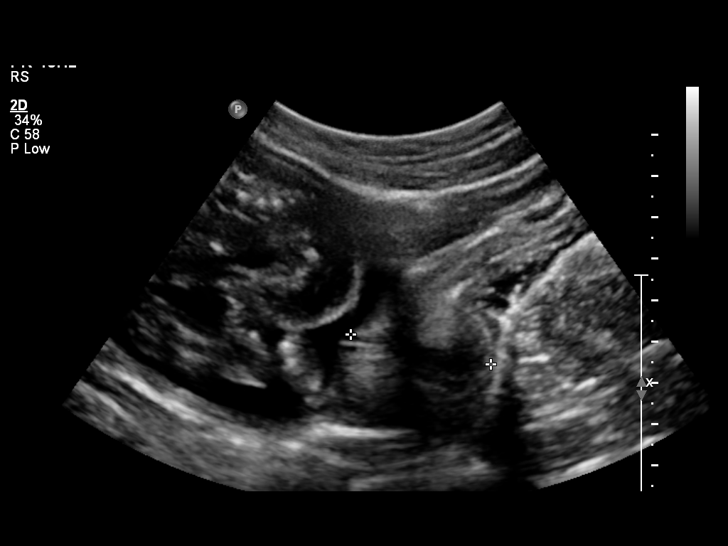
[im 8/10]
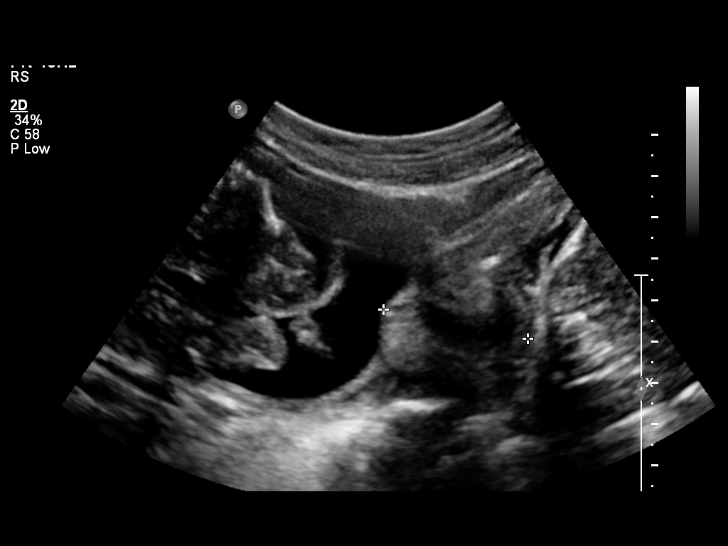
[im 9/10]
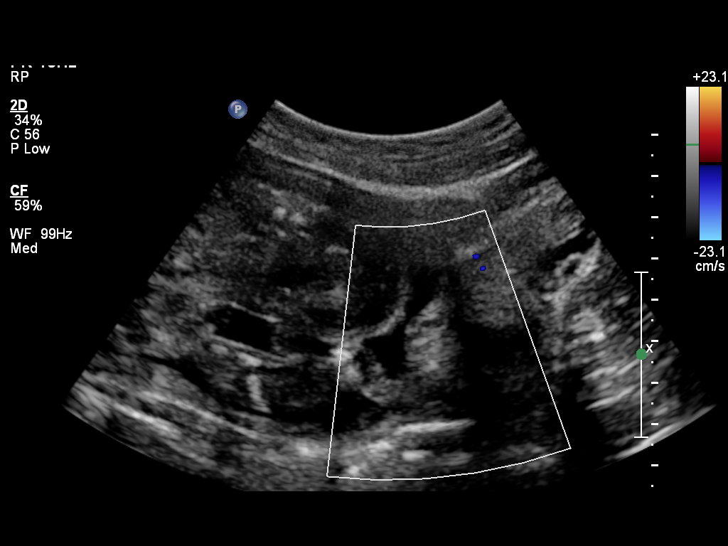
[im 10/10]
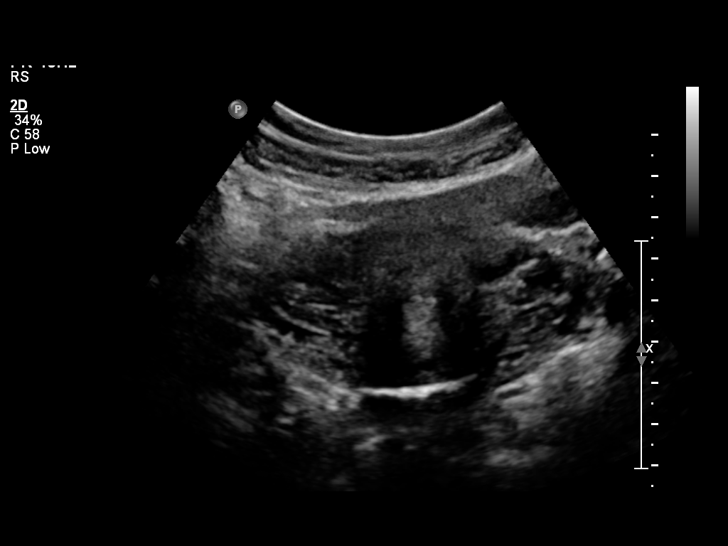

[10 of 10 positions shown; findings below may reference images not displayed]

OBSTETRICS REPORT
                      (Signed Final 04/29/2011 [DATE])

Procedures

 [HOSPITAL]                                         76815.0
Indications

 Assess cervical length
Fetal Evaluation

 Fetal Heart Rate:  153                         bpm
 Cardiac Activity:  Observed
 Presentation:      Breech
 Placenta:          Anterior, above cervical os

 Amniotic Fluid
 AFI FV:      Subjectively within normal limits
                                             Larg Pckt:   6.55   cm
 RLQ:   6.55   cm
Gestational Age

 LMP:           22w 0d       Date:   11/25/10                 EDD:   09/01/11
 Best:          22w 3d    Det. By:   U/S C R L (02/12/11)     EDD:   08/29/11
Cervix Uterus Adnexa

 Cervical Length:   3.49      cm

 Cervix:       Normal appearance by transabdominal scan.
Impression

 Single living IUP with assigned GA of 22w 3d in breech
 position.
 Cervical length is 3.5 cm, measured transabdominally.
 Normal amniotic fluid volume.

## 2011-04-28 NOTE — ED Provider Notes (Signed)
Joan Sambrano Phillips25 y.o.G1P0000 @22w3dNo  chief complaint on file.   SUBJECTIVE  HPI: Today while having a normal BM without straining, she had vaginal pressure sensation and continues to feel the pressure whenever she stands. She self-checked her cx and says she put her finger through the hole. It felt different to her than when she has felt it before pregnancy.  Denies leaking of fluid, vaginal bleeding, abdominal discomfort or cramping. Good FM. No UTI sx or irritaive vaginal discharge.   Past Medical History  Diagnosis Date  . Anxiety     at younger age. was on lexapro  . Infection     bladder, yeast, uri.  . Asthma     childhood  . Abnormal Pap smear     repeat WNL   Past Surgical History  Procedure Date  . Wisdom tooth extraction   . Tooth extraction    History   Social History  . Marital Status: Married    Spouse Name: N/A    Number of Children: N/A  . Years of Education: N/A   Occupational History  . Not on file.   Social History Main Topics  . Smoking status: Former Smoker    Types: Cigarettes    Quit date: 10/15/2004  . Smokeless tobacco: Not on file  . Alcohol Use: No     social  . Drug Use: No     not for many years  . Sexually Active: Yes   Other Topics Concern  . Not on file   Social History Narrative  . No narrative on file   No current facility-administered medications on file prior to encounter.   Current Outpatient Prescriptions on File Prior to Encounter  Medication Sig Dispense Refill  . acetaminophen (TYLENOL) 500 MG tablet Take 500-1,000 mg by mouth every 6 (six) hours as needed. For heartburn       . omeprazole (PRILOSEC) 20 MG capsule Take 20 mg by mouth daily as needed. For heartburn       . Prenatal Vit-Fe Fumarate-FA (PRENATAL MULTIVITAMIN) TABS Take 1 tablet by mouth daily.         Allergies  Allergen Reactions  . Cefdinir (Omnicef) Rash    ROS: Pertinent items in HPI  OBJECTIVE Filed Vitals:   04/28/11 1809  BP: 110/58   Pulse: 48  Temp: 97 F (36.1 C)  Resp: 16   LMP 11/25/2010 DT FHR 147   Physical Exam  Constitutional: She appears well-developed and well-nourished. No distress.       Anxious  HENT:  Head: Normocephalic.  Neck: Neck supple.  Cardiovascular: Normal rate.   Pulmonary/Chest: Effort normal.  Abdominal: Soft. There is no tenderness.       C/w 22 weeks size  Musculoskeletal: Normal range of motion.   VE: cx post, soft. Long, ext loose 1, int closed  MAU course [place] for cx length: 3.49 Results for orders placed during the hospital encounter of 04/28/11 (from the past 24 hour(s))  URINALYSIS, ROUTINE W REFLEX MICROSCOPIC     Status: Normal   Collection Time   04/28/11  6:05 PM      Component Value Range   Color, Urine YELLOW  YELLOW    APPearance CLEAR  CLEAR    Specific Gravity, Urine 1.015  1.005 - 1.030    pH 8.0  5.0 - 8.0    Glucose, UA NEGATIVE  NEGATIVE (mg/dL)   Hgb urine dipstick NEGATIVE  NEGATIVE    Bilirubin Urine NEGATIVE  NEGATIVE  Ketones, ur NEGATIVE  NEGATIVE (mg/dL)   Protein, ur NEGATIVE  NEGATIVE (mg/dL)   Urobilinogen, UA 0.2  0.0 - 1.0 (mg/dL)   Nitrite NEGATIVE  NEGATIVE    Leukocytes, UA NEGATIVE  NEGATIVE      ASSESSMENT  Normal exam   PLAN  Reassured pt. F/U per [facility] routine

## 2011-04-28 NOTE — ED Provider Notes (Signed)
Attestation of Attending Supervision of Advanced Practitioner: Evaluation and management procedures were performed by the PA/NP/CNM/OB Fellow under my supervision/collaboration. Chart reviewed, and agree with management and plan.  Transvaginal ultrasound showed 3.49 cm cervical length, normal appearance, no funneling observed.  Patient was reassured and disharged to home, will follow up at [facility] as scheduled.  Jaynie Collins, M.D. 04/28/2011 7:16 PM

## 2011-04-28 NOTE — Progress Notes (Signed)
Pt was going to bathroom and felt a "strange pressure up inside" and my cervix felt squishy and my finger fit easily inside.  Denies cramping or bleeding today.

## 2011-04-29 NOTE — L&D Delivery Note (Signed)
Delivery Note At 3:40 AM a viable and healthy female was delivered via Vaginal, Spontaneous Delivery (Presentation: ROA).  Dr. Eric Form (neonatologist) present for delivery due to chorioamnionitis and particulate meconium.  Spontaneous cry at delivery.  APGAR: 8, 9; weight 8 lb 8.2 oz (3861 g).   Placenta status: Intact, Spontaneous.  Cord: 3 vessels with the following complications: .  Cord pH: n/a  Pt continued to have rapid bleeding after delivery; cytotec placed immediately in rectum; bladder drained with catheter, proceeded with IM methergine.  After inspecting perineum and no lacerations noted and uterus palpating firm.  Dr. Jolayne Panther called for a suspected cervical laceration.   Anesthesia: Epidural  Episiotomy: None Lacerations: Posterior cervical laceration Suture Repair: 3.0 monocryl x 2 repaired by Dr. Jolayne Panther   Est. Blood Loss (mL): 650  Mom to postpartum.  Baby to nursery-stable.  [facility] 09/07/2011, 4:43 AM

## 2011-05-15 ENCOUNTER — Ambulatory Visit (INDEPENDENT_AMBULATORY_CARE_PROVIDER_SITE_OTHER): Payer: Medicaid Other | Admitting: Obstetrics & Gynecology

## 2011-05-15 DIAGNOSIS — K4090 Unilateral inguinal hernia, without obstruction or gangrene, not specified as recurrent: Secondary | ICD-10-CM

## 2011-05-15 NOTE — Progress Notes (Signed)
Routine visit. Complains of a "tender" bulge in her right mons pubis. On exam the right mons is definitely larger than the left ? Hernia. I will schedule her a referral to a general surgeon. I have given her pain precautions. She will get her 1 hour glucola and labs at her next visit. She reports good FM, denies VB, ROM, CTXs.

## 2011-05-29 ENCOUNTER — Ambulatory Visit (INDEPENDENT_AMBULATORY_CARE_PROVIDER_SITE_OTHER): Payer: Medicaid Other | Admitting: Obstetrics & Gynecology

## 2011-05-29 VITALS — BP 93/56 | Wt 158.0 lb

## 2011-05-29 DIAGNOSIS — Z3400 Encounter for supervision of normal first pregnancy, unspecified trimester: Secondary | ICD-10-CM

## 2011-05-29 LAB — CBC
MCH: 30.8 pg (ref 26.0–34.0)
MCHC: 32.2 g/dL (ref 30.0–36.0)

## 2011-05-29 NOTE — Progress Notes (Signed)
Routine visit for 28 week labs. No problems. Good FM.

## 2011-05-30 LAB — HIV ANTIBODY (ROUTINE TESTING W REFLEX): HIV: NONREACTIVE

## 2011-05-30 LAB — RPR

## 2011-05-30 LAB — GLUCOSE TOLERANCE, 1 HOUR: Glucose, 1 Hour GTT: 95 mg/dL (ref 70–140)

## 2011-06-02 ENCOUNTER — Encounter (INDEPENDENT_AMBULATORY_CARE_PROVIDER_SITE_OTHER): Payer: Self-pay | Admitting: Surgery

## 2011-06-02 ENCOUNTER — Ambulatory Visit (INDEPENDENT_AMBULATORY_CARE_PROVIDER_SITE_OTHER): Payer: Medicaid Other | Admitting: Surgery

## 2011-06-02 VITALS — BP 124/86 | HR 86 | Temp 98.6°F | Resp 24 | Wt 161.6 lb

## 2011-06-02 DIAGNOSIS — R1031 Right lower quadrant pain: Secondary | ICD-10-CM

## 2011-06-02 NOTE — Progress Notes (Signed)
Subjective:     Patient ID: Joan Orozco, female   DOB: 06-26-1985, 26 y.o.   MRN: [id number]  HPI This patient is referred by Dr. Marice Potter for evaluation of a possible right inguinal hernia. The patient is [redacted] weeks pregnant. She started having discomfort in the groin several weeks ago. She has noticed a fullness in that area compared to the left side. She has had no obstructive symptoms. She reports area did not reduce. She describes the pain as in ache.  Review of Systems    Review of systems is negative for chest pain fever shortness of breath nausea vomiting or diarrhea Objective:   Physical Exam She is comfortable in appearance. Lungs are clear bilaterally. Cardiovascular is regular rate and rhythm. Abdomen is soft and gravid. There is a fullness along the right mons. I do not feel a specific hernia. I suspect this represents either adenopathy or fullness in the round ligament.    Assessment:     Patient with right groin pain during pregnancy    Plan:     At this point, she is comfortable and I do not believe emergent expiration of this area or x-ray is warranted. She is due in May. She will come back and see me postpartum she still notices the area. She will also call me should area change or worsen before delivery.

## 2011-06-18 ENCOUNTER — Ambulatory Visit (INDEPENDENT_AMBULATORY_CARE_PROVIDER_SITE_OTHER): Payer: Medicaid Other | Admitting: Obstetrics & Gynecology

## 2011-06-18 VITALS — BP 119/61 | Wt 168.0 lb

## 2011-06-18 DIAGNOSIS — Z3480 Encounter for supervision of other normal pregnancy, unspecified trimester: Secondary | ICD-10-CM

## 2011-06-18 NOTE — Patient Instructions (Signed)
Pregnancy - Third Trimester The third trimester of pregnancy (the last 3 months) is a period of the most rapid growth for you and your baby. The baby approaches a length of 20 inches and a weight of 6 to 10 pounds. The baby is adding on fat and getting ready for life outside your body. While inside, babies have periods of sleeping and waking, suck their thumbs, and hiccups. You can often feel small contractions of the uterus. This is false labor. It is also called Braxton-Hicks contractions. This is like a practice for labor. The usual problems in this stage of pregnancy include more difficulty breathing, swelling of the hands and feet from water retention, and having to urinate more often because of the uterus and baby pressing on your bladder.  PRENATAL EXAMS  Blood work may continue to be done during prenatal exams. These tests are done to check on your health and the probable health of your baby. Blood work is used to follow your blood levels (hemoglobin). Anemia (low hemoglobin) is common during pregnancy. Iron and vitamins are given to help prevent this. You may also continue to be checked for diabetes. Some of the past blood tests may be done again.   The size of the uterus is measured during each visit. This makes sure your baby is growing properly according to your pregnancy dates.   Your blood pressure is checked every prenatal visit. This is to make sure you are not getting toxemia.   Your urine is checked every prenatal visit for infection, diabetes and protein.   Your weight is checked at each visit. This is done to make sure gains are happening at the suggested rate and that you and your baby are growing normally.   Sometimes, an ultrasound is performed to confirm the position and the proper growth and development of the baby. This is a test done that bounces harmless sound waves off the baby so your caregiver can more accurately determine due dates.   Discuss the type of pain  medication and anesthesia you will have during your labor and delivery.   Discuss the possibility and anesthesia if a Cesarean Section might be necessary.   Inform your caregiver if there is any mental or physical violence at home.  Sometimes, a specialized non-stress test, contraction stress test and biophysical profile are done to make sure the baby is not having a problem. Checking the amniotic fluid surrounding the baby is called an amniocentesis. The amniotic fluid is removed by sticking a needle into the belly (abdomen). This is sometimes done near the end of pregnancy if an early delivery is required. In this case, it is done to help make sure the baby's lungs are mature enough for the baby to live outside of the womb. If the lungs are not mature and it is unsafe to deliver the baby, an injection of cortisone medication is given to the mother 1 to 2 days before the delivery. This helps the baby's lungs mature and makes it safer to deliver the baby. CHANGES OCCURING IN THE THIRD TRIMESTER OF PREGNANCY Your body goes through many changes during pregnancy. They vary from person to person. Talk to your caregiver about changes you notice and are concerned about.  During the last trimester, you have probably had an increase in your appetite. It is normal to have cravings for certain foods. This varies from person to person and pregnancy to pregnancy.   You may begin to get stretch marks on your hips,   abdomen, and breasts. These are normal changes in the body during pregnancy. There are no exercises or medications to take which prevent this change.   Constipation may be treated with a stool softener or adding bulk to your diet. Drinking lots of fluids, fiber in vegetables, fruits, and whole grains are helpful.   Exercising is also helpful. If you have been very active up until your pregnancy, most of these activities can be continued during your pregnancy. If you have been less active, it is helpful  to start an exercise program such as walking. Consult your caregiver before starting exercise programs.   Avoid all smoking, alcohol, un-prescribed drugs, herbs and "street drugs" during your pregnancy. These chemicals affect the formation and growth of the baby. Avoid chemicals throughout the pregnancy to ensure the delivery of a healthy infant.   Backache, varicose veins and hemorrhoids may develop or get worse.   You will tire more easily in the third trimester, which is normal.   The baby's movements may be stronger and more often.   You may become short of breath easily.   Your belly button may stick out.   A yellow discharge may leak from your breasts called colostrum.   You may have a bloody mucus discharge. This usually occurs a few days to a week before labor begins.  HOME CARE INSTRUCTIONS   Keep your caregiver's appointments. Follow your caregiver's instructions regarding medication use, exercise, and diet.   During pregnancy, you are providing food for you and your baby. Continue to eat regular, well-balanced meals. Choose foods such as meat, fish, milk and other low fat dairy products, vegetables, fruits, and whole-grain breads and cereals. Your caregiver will tell you of the ideal weight gain.   A physical sexual relationship may be continued throughout pregnancy if there are no other problems such as early (premature) leaking of amniotic fluid from the membranes, vaginal bleeding, or belly (abdominal) pain.   Exercise regularly if there are no restrictions. Check with your caregiver if you are unsure of the safety of your exercises. Greater weight gain will occur in the last 2 trimesters of pregnancy. Exercising helps:   Control your weight.   Get you in shape for labor and delivery.   You lose weight after you deliver.   Rest a lot with legs elevated, or as needed for leg cramps or low back pain.   Wear a good support or jogging bra for breast tenderness during  pregnancy. This may help if worn during sleep. Pads or tissues may be used in the bra if you are leaking colostrum.   Do not use hot tubs, steam rooms, or saunas.   Wear your seat belt when driving. This protects you and your baby if you are in an accident.   Avoid raw meat, cat litter boxes and soil used by cats. These carry germs that can cause birth defects in the baby.   It is easier to loose urine during pregnancy. Tightening up and strengthening the pelvic muscles will help with this problem. You can practice stopping your urination while you are going to the bathroom. These are the same muscles you need to strengthen. It is also the muscles you would use if you were trying to stop from passing gas. You can practice tightening these muscles up 10 times a set and repeating this about 3 times per day. Once you know what muscles to tighten up, do not perform these exercises during urination. It is more likely   to cause an infection by backing up the urine.   Ask for help if you have financial, counseling or nutritional needs during pregnancy. Your caregiver will be able to offer counseling for these needs as well as refer you for other special needs.   Make a list of emergency phone numbers and have them available.   Plan on getting help from family or friends when you go home from the hospital.   Make a trial run to the hospital.   Take prenatal classes with the father to understand, practice and ask questions about the labor and delivery.   Prepare the baby's room/nursery.   Do not travel out of the city unless it is absolutely necessary and with the advice of your caregiver.   Wear only low or no heal shoes to have better balance and prevent falling.  MEDICATIONS AND DRUG USE IN PREGNANCY  Take prenatal vitamins as directed. The vitamin should contain 1 milligram of folic acid. Keep all vitamins out of reach of children. Only a couple vitamins or tablets containing iron may be fatal  to a baby or young child when ingested.   Avoid use of all medications, including herbs, over-the-counter medications, not prescribed or suggested by your caregiver. Only take over-the-counter or prescription medicines for pain, discomfort, or fever as directed by your caregiver. Do not use aspirin, ibuprofen (Motrin, Advil, Nuprin) or naproxen (Aleve) unless OK'd by your caregiver.   Let your caregiver also know about herbs you may be using.   Alcohol is related to a number of birth defects. This includes fetal alcohol syndrome. All alcohol, in any form, should be avoided completely. Smoking will cause low birth rate and premature babies.   Street/illegal drugs are very harmful to the baby. They are absolutely forbidden. A baby born to an addicted mother will be addicted at birth. The baby will go through the same withdrawal an adult does.  SEEK MEDICAL CARE IF: You have any concerns or worries during your pregnancy. It is better to call with your questions if you feel they cannot wait, rather than worry about them. DECISIONS ABOUT CIRCUMCISION You may or may not know the sex of your baby. If you know your baby is a boy, it may be time to think about circumcision. Circumcision is the removal of the foreskin of the penis. This is the skin that covers the sensitive end of the penis. There is no proven medical need for this. Often this decision is made on what is popular at the time or based upon religious beliefs and social issues. You can discuss these issues with your caregiver or pediatrician. SEEK IMMEDIATE MEDICAL CARE IF:   An unexplained oral temperature above 102 F (38.9 C) develops, or as your caregiver suggests.   You have leaking of fluid from the vagina (birth canal). If leaking membranes are suspected, take your temperature and tell your caregiver of this when you call.   There is vaginal spotting, bleeding or passing clots. Tell your caregiver of the amount and how many pads are  used.   You develop a bad smelling vaginal discharge with a change in the color from clear to white.   You develop vomiting that lasts more than 24 hours.   You develop chills or fever.   You develop shortness of breath.   You develop burning on urination.   You loose more than 2 pounds of weight or gain more than 2 pounds of weight or as suggested by your   caregiver.   You notice sudden swelling of your face, hands, and feet or legs.   You develop belly (abdominal) pain. Round ligament discomfort is a common non-cancerous (benign) cause of abdominal pain in pregnancy. Your caregiver still must evaluate you.   You develop a severe headache that does not go away.   You develop visual problems, blurred or double vision.   If you have not felt your baby move for more than 1 hour. If you think the baby is not moving as much as usual, eat something with sugar in it and lie down on your left side for an hour. The baby should move at least 4 to 5 times per hour. Call right away if your baby moves less than that.   You fall, are in a car accident or any kind of trauma.   There is mental or physical violence at home.  Document Released: 04/08/2001 Document Revised: 12/25/2010 Document Reviewed: 10/11/2008 [employer] Patient Information 2012 [employer]. 

## 2011-06-18 NOTE — Progress Notes (Signed)
Normal GTT and 3rd trimester labs.  No other complaints or concerns. Multiple questions answered for patient about what to expect. Fetal movement and labor precautions reviewed.

## 2011-07-03 ENCOUNTER — Ambulatory Visit (INDEPENDENT_AMBULATORY_CARE_PROVIDER_SITE_OTHER): Payer: Medicaid Other | Admitting: Obstetrics & Gynecology

## 2011-07-03 VITALS — BP 112/62 | Wt 172.0 lb

## 2011-07-03 DIAGNOSIS — Z3400 Encounter for supervision of normal first pregnancy, unspecified trimester: Secondary | ICD-10-CM

## 2011-07-03 NOTE — Progress Notes (Signed)
Routine visit. 1 hour normal at 95. Complains of vulvar pain. 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 Rec ice and elevation. Surgeon says to worry about hernia after pregnancy if it is a problem.

## 2011-07-17 ENCOUNTER — Ambulatory Visit (INDEPENDENT_AMBULATORY_CARE_PROVIDER_SITE_OTHER): Payer: Medicaid Other | Admitting: Obstetrics & Gynecology

## 2011-07-17 DIAGNOSIS — Z3480 Encounter for supervision of other normal pregnancy, unspecified trimester: Secondary | ICD-10-CM

## 2011-07-17 NOTE — Progress Notes (Signed)
No complaints or concerns.  Fetal movement and labor precautions reviewed.  Pelvic cultures next visit in two weeks.

## 2011-07-17 NOTE — Patient Instructions (Signed)
Return to clinic for any obstetric concerns or go to [facility] for evaluation  

## 2011-07-31 ENCOUNTER — Ambulatory Visit (INDEPENDENT_AMBULATORY_CARE_PROVIDER_SITE_OTHER): Payer: Medicaid Other | Admitting: Obstetrics & Gynecology

## 2011-07-31 VITALS — BP 121/60 | Wt 175.0 lb

## 2011-07-31 DIAGNOSIS — Z3400 Encounter for supervision of normal first pregnancy, unspecified trimester: Secondary | ICD-10-CM

## 2011-07-31 NOTE — Progress Notes (Signed)
p-60 

## 2011-07-31 NOTE — Progress Notes (Signed)
Routine visits. No complaints, reports good FM. Labor precautions review. Cervical cultures done

## 2011-08-01 LAB — GC/CHLAMYDIA PROBE AMP, GENITAL: GC Probe Amp, Genital: NEGATIVE

## 2011-08-01 LAB — OB RESULTS CONSOLE GBS: GBS: NEGATIVE

## 2011-08-07 ENCOUNTER — Ambulatory Visit (INDEPENDENT_AMBULATORY_CARE_PROVIDER_SITE_OTHER): Payer: Medicaid Other | Admitting: Obstetrics & Gynecology

## 2011-08-07 VITALS — BP 111/68 | Wt 179.0 lb

## 2011-08-07 DIAGNOSIS — Z3480 Encounter for supervision of other normal pregnancy, unspecified trimester: Secondary | ICD-10-CM

## 2011-08-07 NOTE — Progress Notes (Signed)
GC, Chlam and GBS all negative.  No other complaints or concerns.  Fetal movement and labor precautions reviewed.  Return in one week.

## 2011-08-07 NOTE — Patient Instructions (Signed)
Return to clinic for any obstetric concerns or go to [facility] for evaluation  

## 2011-08-14 ENCOUNTER — Ambulatory Visit (INDEPENDENT_AMBULATORY_CARE_PROVIDER_SITE_OTHER): Payer: Medicaid Other | Admitting: Obstetrics & Gynecology

## 2011-08-14 ENCOUNTER — Encounter: Payer: Self-pay | Admitting: Obstetrics & Gynecology

## 2011-08-14 VITALS — BP 110/62 | Wt 180.0 lb

## 2011-08-14 DIAGNOSIS — Z3400 Encounter for supervision of normal first pregnancy, unspecified trimester: Secondary | ICD-10-CM

## 2011-08-14 NOTE — Progress Notes (Signed)
Routine visit. No problems. Good FM. Labor precautions.

## 2011-08-14 NOTE — Progress Notes (Signed)
Routine prenatal visit.  Swelling is better this week than last.  Some cramping, nothing regular.

## 2011-08-21 ENCOUNTER — Ambulatory Visit (INDEPENDENT_AMBULATORY_CARE_PROVIDER_SITE_OTHER): Payer: Medicaid Other | Admitting: Family Medicine

## 2011-08-21 VITALS — BP 115/70 | Wt 182.0 lb

## 2011-08-21 DIAGNOSIS — Z3480 Encounter for supervision of other normal pregnancy, unspecified trimester: Secondary | ICD-10-CM

## 2011-08-21 DIAGNOSIS — Z23 Encounter for immunization: Secondary | ICD-10-CM

## 2011-08-21 MED ORDER — TETANUS-DIPHTH-ACELL PERTUSSIS 5-2.5-18.5 LF-MCG/0.5 IM SUSP: 0.5000 mL | Freq: Once | INTRAMUSCULAR | Status: DC

## 2011-08-21 NOTE — Progress Notes (Signed)
Not having problems.  Labor precautions reviewed.

## 2011-08-21 NOTE — Patient Instructions (Addendum)
Normal Labor and Delivery Your caregiver must first be sure you are in labor. Signs of labor include:  You may pass what is called "the mucus plug" before labor begins. This is a small amount of blood stained mucus.   Regular uterine contractions.   The time between contractions get closer together.   The discomfort and pain gradually gets more intense.   Pains are mostly located in the back.   Pains get worse when walking.   The cervix (the opening of the uterus becomes thinner (begins to efface) and opens up (dilates).  Once you are in labor and admitted into the hospital or care center, your caregiver will do the following:  A complete physical examination.   Check your vital signs (blood pressure, pulse, temperature and the fetal heart rate).   Do a vaginal examination (using a sterile glove and lubricant) to determine:   The position (presentation) of the baby (head [vertex] or buttock first).   The level (station) of the baby's head in the birth canal.   The effacement and dilatation of the cervix.   You may have your pubic hair shaved and be given an enema depending on your caregiver and the circumstance.   An electronic monitor is usually placed on your abdomen. The monitor follows the length and intensity of the contractions, as well as the baby's heart rate.   Usually, your caregiver will insert an IV in your arm with a bottle of sugar water. This is done as a precaution so that medications can be given to you quickly during labor or delivery.  NORMAL LABOR AND DELIVERY IS DIVIDED UP INTO 3 STAGES: First Stage This is when regular contractions begin and the cervix begins to efface and dilate. This stage can last from 3 to 15 hours. The end of the first stage is when the cervix is 100% effaced and 10 centimeters dilated. Pain medications may be given by   Injection (morphine, demerol, etc.)   Regional anesthesia (spinal, caudal or epidural, anesthetics given in  different locations of the spine). Paracervical pain medication may be given, which is an injection of and anesthetic on each side of the cervix.  A pregnant woman may request to have "Natural Childbirth" which is not to have any medications or anesthesia during her labor and delivery. Second Stage This is when the baby comes down through the birth canal (vagina) and is born. This can take 1 to 4 hours. As the baby's head comes down through the birth canal, you may feel like you are going to have a bowel movement. You will get the urge to bear down and push until the baby is delivered. As the baby's head is being delivered, the caregiver will decide if an episiotomy (a cut in the perineum and vagina area) is needed to prevent tearing of the tissue in this area. The episiotomy is sewn up after the delivery of the baby and placenta. Sometimes a mask with nitrous oxide is given for the mother to breath during the delivery of the baby to help if there is too much pain. The end of Stage 2 is when the baby is fully delivered. Then when the umbilical cord stops pulsating it is clamped and cut. Third Stage The third stage begins after the baby is completely delivered and ends after the placenta (afterbirth) is delivered. This usually takes 5 to 30 minutes. After the placenta is delivered, a medication is given either by intravenous or injection to help contract   the uterus and prevent bleeding. The third stage is not painful and pain medication is usually not necessary. If an episiotomy was done, it is repaired at this time. After the delivery, the mother is watched and monitored closely for 1 to 2 hours to make sure there is no postpartum bleeding (hemorrhage). If there is a lot of bleeding, medication is given to contract the uterus and stop the bleeding. Document Released: 01/22/2008 Document Revised: 04/03/2011 Document Reviewed: 01/22/2008 [employer] Patient Information 2012 [employer]. Contraception  Choices Contraception (birth control) is the use of any methods or devices to prevent pregnancy. Below are some methods to help avoid pregnancy. HORMONAL METHODS   Contraceptive implant. This is a thin, plastic tube containing progesterone hormone. It does not contain estrogen hormone. Your caregiver inserts the tube in the inner part of the upper arm. The tube can remain in place for up to 3 years. After 3 years, the implant must be removed. The implant prevents the ovaries from releasing an egg (ovulation), thickens the cervical mucus which prevents sperm from entering the uterus, and thins the lining of the inside of the uterus.   Progesterone-only injections. These injections are given every 3 months by your caregiver to prevent pregnancy. This synthetic progesterone hormone stops the ovaries from releasing eggs. It also thickens cervical mucus and changes the uterine lining. This makes it harder for sperm to survive in the uterus.   Birth control pills. These pills contain estrogen and progesterone hormone. They work by stopping the egg from forming in the ovary (ovulation). Birth control pills are prescribed by a caregiver.Birth control pills can also be used to treat heavy periods.   Minipill. This type of birth control pill contains only the progesterone hormone. They are taken every day of each month and must be prescribed by your caregiver.   Birth control patch. The patch contains hormones similar to those in birth control pills. It must be changed once a week and is prescribed by a caregiver.   Vaginal ring. The ring contains hormones similar to those in birth control pills. It is left in the vagina for 3 weeks, removed for 1 week, and then a new one is put back in place. The patient must be comfortable inserting and removing the ring from the vagina.A caregiver's prescription is necessary.   Emergency contraception. Emergency contraceptives prevent pregnancy after unprotected sexual  intercourse. This pill can be taken right after sex or up to 5 days after unprotected sex. It is most effective the sooner you take the pills after having sexual intercourse. Emergency contraceptive pills are available without a prescription. Check with your pharmacist. Do not use emergency contraception as your only form of birth control.  BARRIER METHODS   Female condom. This is a thin sheath (latex or rubber) that is worn over the penis during sexual intercourse. It can be used with spermicide to increase effectiveness.   Female condom. This is a soft, loose-fitting sheath that is put into the vagina before sexual intercourse.   Diaphragm. This is a soft, latex, dome-shaped barrier that must be fitted by a caregiver. It is inserted into the vagina, along with a spermicidal jelly. It is inserted before intercourse. The diaphragm should be left in the vagina for 6 to 8 hours after intercourse.   Cervical cap. This is a round, soft, latex or plastic cup that fits over the cervix and must be fitted by a caregiver. The cap can be left in place for up to   48 hours after intercourse.   Sponge. This is a soft, circular piece of polyurethane foam. The sponge has spermicide in it. It is inserted into the vagina after wetting it and before sexual intercourse.   Spermicides. These are chemicals that kill or block sperm from entering the cervix and uterus. They come in the form of creams, jellies, suppositories, foam, or tablets. They do not require a prescription. They are inserted into the vagina with an applicator before having sexual intercourse. The process must be repeated every time you have sexual intercourse.  INTRAUTERINE CONTRACEPTION  Intrauterine device (IUD). This is a T-shaped device that is put in a woman's uterus during a menstrual period to prevent pregnancy. There are 2 types:   Copper IUD. This type of IUD is wrapped in copper wire and is placed inside the uterus. Copper makes the uterus and  fallopian tubes produce a fluid that kills sperm. It can stay in place for 10 years.   Hormone IUD. This type of IUD contains the hormone progestin (synthetic progesterone). The hormone thickens the cervical mucus and prevents sperm from entering the uterus, and it also thins the uterine lining to prevent implantation of a fertilized egg. The hormone can weaken or kill the sperm that get into the uterus. It can stay in place for 5 years.  PERMANENT METHODS OF CONTRACEPTION  Female tubal ligation. This is when the woman's fallopian tubes are surgically sealed, tied, or blocked to prevent the egg from traveling to the uterus.   Female sterilization. This is when the female has the tubes that carry sperm tied off (vasectomy).This blocks sperm from entering the vagina during sexual intercourse. After the procedure, the man can still ejaculate fluid (semen).  NATURAL PLANNING METHODS  Natural family planning. This is not having sexual intercourse or using a barrier method (condom, diaphragm, cervical cap) on days the woman could become pregnant.   Calendar method. This is keeping track of the length of each menstrual cycle and identifying when you are fertile.   Ovulation method. This is avoiding sexual intercourse during ovulation.   Symptothermal method. This is avoiding sexual intercourse during ovulation, using a thermometer and ovulation symptoms.   Post-ovulation method. This is timing sexual intercourse after you have ovulated.  Regardless of which type or method of contraception you choose, it is important that you use condoms to protect against the transmission of sexually transmitted diseases (STDs). Talk with your caregiver about which form of contraception is most appropriate for you. Document Released: 04/14/2005 Document Revised: 04/03/2011 Document Reviewed: 08/21/2010 [employer] Patient Information 2012 [employer]. Breastfeeding BENEFITS OF BREASTFEEDING For the baby  The first  milk (colostrum) helps the baby's digestive system function better.   There are antibodies from the mother in the milk that help the baby fight off infections.   The baby has a lower incidence of asthma, allergies, and SIDS (sudden infant death syndrome).   The nutrients in breast milk are better than formulas for the baby and helps the baby's brain grow better.   Babies who breastfeed have less gas, colic, and constipation.  For the mother  Breastfeeding helps develop a very special bond between mother and baby.   It is more convenient, always available at the correct temperature and cheaper than formula feeding.   It burns calories in the mother and helps with losing weight that was gained during pregnancy.   It makes the uterus contract back down to normal size faster and slows bleeding following delivery.     Breastfeeding mothers have a lower risk of developing breast cancer.  NURSE FREQUENTLY  A healthy, full-term baby may breastfeed as often as every hour or space his or her feedings to every 3 hours.   How often to nurse will vary from baby to baby. Watch your baby for signs of hunger, not the clock.   Nurse as often as the baby requests, or when you feel the need to reduce the fullness of your breasts.   Awaken the baby if it has been 3 to 4 hours since the last feeding.   Frequent feeding will help the mother make more milk and will prevent problems like sore nipples and engorgement of the breasts.  BABY'S POSITION AT THE BREAST  Whether lying down or sitting, be sure that the baby's tummy is facing your tummy.   Support the breast with 4 fingers underneath the breast and the thumb above. Make sure your fingers are well away from the nipple and baby's mouth.   Stroke the baby's lips and cheek closest to the breast gently with your finger or nipple.   When the baby's mouth is open wide enough, place all of your nipple and as much of the dark area around the nipple as  possible into your baby's mouth.   Pull the baby in close so the tip of the nose and the baby's cheeks touch the breast during the feeding.  FEEDINGS  The length of each feeding varies from baby to baby and from feeding to feeding.   The baby must suck about 2 to 3 minutes for your milk to get to him or her. This is called a "let down." For this reason, allow the baby to feed on each breast as long as he or she wants. Your baby will end the feeding when he or she has received the right balance of nutrients.   To break the suction, put your finger into the corner of the baby's mouth and slide it between his or her gums before removing your breast from his or her mouth. This will help prevent sore nipples.  REDUCING BREAST ENGORGEMENT  In the first week after your baby is born, you may experience signs of breast engorgement. When breasts are engorged, they feel heavy, warm, full, and may be tender to the touch. You can reduce engorgement if you:   Nurse frequently, every 2 to 3 hours. Mothers who breastfeed early and often have fewer problems with engorgement.   Place light ice packs on your breasts between feedings. This reduces swelling. Wrap the ice packs in a lightweight towel to protect your skin.   Apply moist hot packs to your breast for 5 to 10 minutes before each feeding. This increases circulation and helps the milk flow.   Gently massage your breast before and during the feeding.   Make sure that the baby empties at least one breast at every feeding before switching sides.   Use a breast pump to empty the breasts if your baby is sleepy or not nursing well. You may also want to pump if you are returning to work or or you feel you are getting engorged.   Avoid bottle feeds, pacifiers or supplemental feedings of water or juice in place of breastfeeding.   Be sure the baby is latched on and positioned properly while breastfeeding.   Prevent fatigue, stress, and anemia.   Wear a  supportive bra, avoiding underwire styles.   Eat a balanced diet with enough fluids.    If you follow these suggestions, your engorgement should improve in 24 to 48 hours. If you are still experiencing difficulty, call your lactation consultant or caregiver. IS MY BABY GETTING ENOUGH MILK? Sometimes, mothers worry about whether their babies are getting enough milk. You can be assured that your baby is getting enough milk if:  The baby is actively sucking and you hear swallowing.   The baby nurses at least 8 to 12 times in a 24 hour time period. Nurse your baby until he or she unlatches or falls asleep at the first breast (at least 10 to 20 minutes), then offer the second side.   The baby is wetting 5 to 6 disposable diapers (6 to 8 cloth diapers) in a 24 hour period by 5 to 6 days of age.   The baby is having at least 2 to 3 stools every 24 hours for the first few months. Breast milk is all the food your baby needs. It is not necessary for your baby to have water or formula. In fact, to help your breasts make more milk, it is best not to give your baby supplemental feedings during the early weeks.   The stool should be soft and yellow.   The baby should gain 4 to 7 ounces per week after he is 4 days old.  TAKE CARE OF YOURSELF Take care of your breasts by:  Bathing or showering daily.   Avoiding the use of soaps on your nipples.   Start feedings on your left breast at one feeding and on your right breast at the next feeding.   You will notice an increase in your milk supply 2 to 5 days after delivery. You may feel some discomfort from engorgement, which makes your breasts very firm and often tender. Engorgement "peaks" out within 24 to 48 hours. In the meantime, apply warm moist towels to your breasts for 5 to 10 minutes before feeding. Gentle massage and expression of some milk before feeding will soften your breasts, making it easier for your baby to latch on. Wear a well fitting nursing  bra and air dry your nipples for 10 to 15 minutes after each feeding.   Only use cotton bra pads.   Only use pure lanolin on your nipples after nursing. You do not need to wash it off before nursing.  Take care of yourself by:   Eating well-balanced meals and nutritious snacks.   Drinking milk, fruit juice, and water to satisfy your thirst (about 8 glasses a day).   Getting plenty of rest.   Increasing calcium in your diet (1200 mg a day).   Avoiding foods that you notice affect the baby in a bad way.  SEEK MEDICAL CARE IF:   You have any questions or difficulty with breastfeeding.   You need help.   You have a hard, red, sore area on your breast, accompanied by a fever of 100.5 F (38.1 C) or more.   Your baby is too sleepy to eat well or is having trouble sleeping.   Your baby is wetting less than 6 diapers per day, by 5 days of age.   Your baby's skin or white part of his or her eyes is more yellow than it was in the hospital.   You feel depressed.  Document Released: 04/14/2005 Document Revised: 04/03/2011 Document Reviewed: 11/27/2008 [employer] Patient Information 2012 [employer]. 

## 2011-08-28 ENCOUNTER — Ambulatory Visit (INDEPENDENT_AMBULATORY_CARE_PROVIDER_SITE_OTHER): Payer: Medicaid Other | Admitting: Obstetrics & Gynecology

## 2011-08-28 ENCOUNTER — Encounter: Payer: Self-pay | Admitting: Obstetrics & Gynecology

## 2011-08-28 VITALS — BP 112/61 | Wt 184.0 lb

## 2011-08-28 DIAGNOSIS — Z3400 Encounter for supervision of normal first pregnancy, unspecified trimester: Secondary | ICD-10-CM

## 2011-08-28 NOTE — Progress Notes (Signed)
Routine visit. Labor precautions. Membranes stripped per patient request. Good FM, no problems

## 2011-09-01 ENCOUNTER — Ambulatory Visit (INDEPENDENT_AMBULATORY_CARE_PROVIDER_SITE_OTHER): Payer: Medicaid Other | Admitting: Obstetrics and Gynecology

## 2011-09-01 VITALS — BP 122/72 | Wt 188.0 lb

## 2011-09-01 DIAGNOSIS — Z3480 Encounter for supervision of other normal pregnancy, unspecified trimester: Secondary | ICD-10-CM

## 2011-09-01 NOTE — Progress Notes (Signed)
Patient doing well without complaints. Membranes stripped again at this visit. FM/labor precautions reviewed. NST today, AFI check on Wednesday or Thursday. Plan for induction of labor on Friday. NST reviewed and reactive

## 2011-09-02 ENCOUNTER — Telehealth (HOSPITAL_COMMUNITY): Payer: Self-pay | Admitting: *Deleted

## 2011-09-02 ENCOUNTER — Encounter (HOSPITAL_COMMUNITY): Payer: Self-pay | Admitting: *Deleted

## 2011-09-02 NOTE — Telephone Encounter (Signed)
Preadmission screen  

## 2011-09-04 ENCOUNTER — Ambulatory Visit (INDEPENDENT_AMBULATORY_CARE_PROVIDER_SITE_OTHER): Payer: Medicaid Other | Admitting: *Deleted

## 2011-09-04 VITALS — BP 120/68 | Wt 191.1 lb

## 2011-09-04 DIAGNOSIS — O480 Post-term pregnancy: Secondary | ICD-10-CM

## 2011-09-04 NOTE — Progress Notes (Signed)
P = 82   IOL scheduled 5/10 @ 0730

## 2011-09-04 NOTE — Progress Notes (Incomplete)
NST reactive today.

## 2011-09-05 ENCOUNTER — Encounter (HOSPITAL_COMMUNITY): Payer: Self-pay

## 2011-09-05 ENCOUNTER — Inpatient Hospital Stay (HOSPITAL_COMMUNITY)
Admission: RE | Admit: 2011-09-05 | Discharge: 2011-09-08 | DRG: 774 | Disposition: A | Discharge status: Home / Self Care | Payer: Medicaid Other | Source: Ambulatory Visit | Attending: Obstetrics and Gynecology | Admitting: Obstetrics and Gynecology

## 2011-09-05 VITALS — BP 116/69 | HR 75 | Temp 97.2°F | Resp 18 | Ht 63.0 in | Wt 190.0 lb

## 2011-09-05 DIAGNOSIS — S3763XA Laceration of uterus, initial encounter: Secondary | ICD-10-CM | POA: Diagnosis not present

## 2011-09-05 DIAGNOSIS — O411090 Infection of amniotic sac and membranes, unspecified, unspecified trimester, not applicable or unspecified: Secondary | ICD-10-CM | POA: Diagnosis present

## 2011-09-05 DIAGNOSIS — Z3480 Encounter for supervision of other normal pregnancy, unspecified trimester: Secondary | ICD-10-CM

## 2011-09-05 DIAGNOSIS — O480 Post-term pregnancy: Principal | ICD-10-CM | POA: Diagnosis present

## 2011-09-05 LAB — CBC
HCT: 37.8 % (ref 36.0–46.0)
Hemoglobin: 12.6 g/dL (ref 12.0–15.0)
MCH: 30.6 pg (ref 26.0–34.0)
MCHC: 33.3 g/dL (ref 30.0–36.0)
MCV: 91.7 fL (ref 78.0–100.0)
Platelets: 193 K/uL (ref 150–400)
RBC: 4.12 MIL/uL (ref 3.87–5.11)
RDW: 13.1 % (ref 11.5–15.5)
WBC: 13.3 K/uL — ABNORMAL HIGH (ref 4.0–10.5)

## 2011-09-05 LAB — SYPHILIS: RPR W/REFLEX TO RPR TITER AND TREPONEMAL ANTIBODIES, TRADITIONAL SCREENING AND DIAGNOSIS ALGORITHM: RPR Ser Ql: NONREACTIVE

## 2011-09-05 MED ORDER — OXYCODONE-ACETAMINOPHEN 5-325 MG PO TABS: 1.0000 | ORAL_TABLET | ORAL | Status: DC | PRN

## 2011-09-05 MED ORDER — CITRIC ACID-SODIUM CITRATE 334-500 MG/5ML PO SOLN: 30.0000 mL | ORAL | Status: DC | PRN

## 2011-09-05 MED ORDER — FENTANYL 2.5 MCG/ML BUPIVACAINE 1/10 % EPIDURAL INFUSION (WH - ANES)
14.0000 mL/h | INTRAMUSCULAR | Status: DC
  Administered 2011-09-06 – 2011-09-07 (×4): 14 mL/h via EPIDURAL
  Filled 2011-09-05 (×5): qty 60

## 2011-09-05 MED ORDER — IBUPROFEN 600 MG PO TABS
600.0000 mg | ORAL_TABLET | Freq: Four times a day (QID) | ORAL | Status: DC | PRN
  Administered 2011-09-07: 600 mg via ORAL
  Filled 2011-09-05: qty 1

## 2011-09-05 MED ORDER — LACTATED RINGERS IV SOLN
INTRAVENOUS | Status: DC
  Administered 2011-09-05 – 2011-09-06 (×6): via INTRAVENOUS

## 2011-09-05 MED ORDER — FLEET ENEMA 7-19 GM/118ML RE ENEM: 1.0000 | ENEMA | RECTAL | Status: DC | PRN

## 2011-09-05 MED ORDER — PHENYLEPHRINE 40 MCG/ML (10ML) SYRINGE FOR IV PUSH (FOR BLOOD PRESSURE SUPPORT)
80.0000 ug | PREFILLED_SYRINGE | INTRAVENOUS | Status: DC | PRN
  Filled 2011-09-05: qty 5

## 2011-09-05 MED ORDER — LACTATED RINGERS IV SOLN
500.0000 mL | Freq: Once | INTRAVENOUS | Status: AC
  Administered 2011-09-06: 500 mL via INTRAVENOUS

## 2011-09-05 MED ORDER — NALBUPHINE SYRINGE 5 MG/0.5 ML
5.0000 mg | INJECTION | INTRAMUSCULAR | Status: DC | PRN
  Administered 2011-09-05 – 2011-09-06 (×2): 5 mg via INTRAVENOUS
  Filled 2011-09-05 (×2): qty 0.5

## 2011-09-05 MED ORDER — LIDOCAINE HCL (PF) 1 % IJ SOLN
30.0000 mL | INTRAMUSCULAR | Status: DC | PRN
  Filled 2011-09-05: qty 30

## 2011-09-05 MED ORDER — DIPHENHYDRAMINE HCL 50 MG/ML IJ SOLN
12.5000 mg | INTRAMUSCULAR | Status: DC | PRN
  Administered 2011-09-06: 12.5 mg via INTRAVENOUS
  Filled 2011-09-05: qty 1

## 2011-09-05 MED ORDER — LACTATED RINGERS IV SOLN
500.0000 mL | INTRAVENOUS | Status: DC | PRN
  Administered 2011-09-06 – 2011-09-07 (×2): 500 mL via INTRAVENOUS

## 2011-09-05 MED ORDER — EPHEDRINE 5 MG/ML INJ
10.0000 mg | INTRAVENOUS | Status: DC | PRN
  Filled 2011-09-05: qty 4

## 2011-09-05 MED ORDER — OXYTOCIN BOLUS FROM INFUSION
500.0000 mL | Freq: Once | INTRAVENOUS | Status: DC
  Filled 2011-09-05: qty 1000
  Filled 2011-09-05: qty 500

## 2011-09-05 MED ORDER — EPHEDRINE 5 MG/ML INJ: 10.0000 mg | INTRAVENOUS | Status: DC | PRN

## 2011-09-05 MED ORDER — PHENYLEPHRINE 40 MCG/ML (10ML) SYRINGE FOR IV PUSH (FOR BLOOD PRESSURE SUPPORT): 80.0000 ug | PREFILLED_SYRINGE | INTRAVENOUS | Status: DC | PRN

## 2011-09-05 MED ORDER — ACETAMINOPHEN 325 MG PO TABS
650.0000 mg | ORAL_TABLET | ORAL | Status: DC | PRN
  Administered 2011-09-05 – 2011-09-06 (×2): 650 mg via ORAL
  Filled 2011-09-05 (×3): qty 2

## 2011-09-05 MED ORDER — TERBUTALINE SULFATE 1 MG/ML IJ SOLN: 0.2500 mg | Freq: Once | INTRAMUSCULAR | Status: AC | PRN

## 2011-09-05 MED ORDER — OXYTOCIN 20 UNITS IN LACTATED RINGERS INFUSION - SIMPLE
1.0000 m[IU]/min | INTRAVENOUS | Status: DC
  Administered 2011-09-06: 2 m[IU]/min via INTRAVENOUS
  Administered 2011-09-07: 999 m[IU]/min via INTRAVENOUS
  Filled 2011-09-05: qty 1000

## 2011-09-05 MED ORDER — MISOPROSTOL 25 MCG QUARTER TABLET
50.0000 ug | ORAL_TABLET | ORAL | Status: DC
  Administered 2011-09-05 (×2): 50 ug via ORAL
  Filled 2011-09-05 (×2): qty 0.5

## 2011-09-05 MED ORDER — OXYTOCIN 20 UNITS IN LACTATED RINGERS INFUSION - SIMPLE
125.0000 mL/h | Freq: Once | INTRAVENOUS | Status: AC
  Administered 2011-09-07: 125 mL/h via INTRAVENOUS

## 2011-09-05 MED ORDER — ONDANSETRON HCL 4 MG/2ML IJ SOLN
4.0000 mg | Freq: Four times a day (QID) | INTRAMUSCULAR | Status: DC | PRN
  Filled 2011-09-05: qty 2

## 2011-09-05 NOTE — Progress Notes (Signed)
   Subjective: Pt reports feeling increased in pain with contractions.  No needs or concerns.   Objective: BP 120/66  Pulse 81  Temp(Src) 98.2 F (36.8 C) (Oral)  Resp 18  Ht 5\' 3"  (1.6 m)  Wt 86.183 kg (190 lb)  BMI 33.66 kg/m2  LMP 11/25/2010      FHT:  FHR: 130's bpm, variability: moderate,  accelerations:  Present,  decelerations:  Present mild variables UC:   irregular, every 6-10 minutes SVE:   Dilation: 1.5 Exam by:: Muhammad CNM Foley bulb remains in place when tugged  Labs: Lab Results  Component Value Date   WBC 13.3* 09/05/2011   HGB 12.6 09/05/2011   HCT 37.8 09/05/2011   MCV 91.7 09/05/2011   PLT 193 09/05/2011    Assessment / Plan: Induction of Labor, Postdates  Labor: Progressing normally Preeclampsia:  n/a Fetal Wellbeing:  Category II Pain Control:  Labor support without medications I/D:  n/a Anticipated MOD:  NSVD  MUHAMMAD,Joan Orozco 09/05/2011, 2:10 PM

## 2011-09-05 NOTE — Progress Notes (Signed)
   Joan Orozco is a 26 y.o. G1P0000 at [redacted]w[redacted]d  admitted for induction of labor due to Post dates..  Subjective:  No complaints Objective: BP 125/45  Pulse 85  Temp(Src) 98.2 F (36.8 C) (Oral)  Resp 18  Ht 5\' 3"  (1.6 m)  Wt 86.183 kg (190 lb)  BMI 33.66 kg/m2  LMP 11/25/2010    FHT:  FHR: 140 bpm, variability: moderate,  accelerations:  Present,  decelerations:  Absent UC:   none SVE:   1-2/foley was about 6 inches up above cervix.  Pulled down and traction placed  Labs: Lab Results  Component Value Date   WBC 13.3* 09/05/2011   HGB 12.6 09/05/2011   HCT 37.8 09/05/2011   MCV 91.7 09/05/2011   PLT 193 09/05/2011    Assessment / Plan: IOL for postdates, ripening phase  WIl add oral cytotec until foley falls out  Labor: no Fetal Wellbeing:  Category I Pain Control:  Labor support without medications Anticipated MOD:  NSVD  CRESENZO-DISHMAN,Kendarrius Tanzi 09/05/2011, 4:46 PM

## 2011-09-05 NOTE — Progress Notes (Signed)
Foley bulb placed digitally without difficulty.  Answered all questions of pt and family.  Informed pt of foley bulb use and expectation.  [facility]

## 2011-09-05 NOTE — Progress Notes (Signed)
Joan Orozco is a 26 y.o. G1P0000 at [redacted]w[redacted]d admitted for induction of labor due to Post dates.   Subjective: Pt reports foley bulb came out while using the bathroom.  She is comfortable and resting without pain medication at this time.  Her s/o is in room for support.  Objective: BP 118/63  Pulse 68  Temp(Src) 98.5 F (36.9 C) (Oral)  Resp 18  Ht 5\' 3"  (1.6 m)  Wt 86.183 kg (190 lb)  BMI 33.66 kg/m2  LMP 11/25/2010      FHT:  FHR: 155 bpm, variability: moderate,  accelerations:  Present,  decelerations:  Absent UC:   Irregular irritability SVE:   5cm and 80% per RN  Labs: Lab Results  Component Value Date   WBC 13.3* 09/05/2011   HGB 12.6 09/05/2011   HCT 37.8 09/05/2011   MCV 91.7 09/05/2011   PLT 193 09/05/2011    Assessment / Plan: Induction of labor due to postterm,  progressing well with foley bulb/Cytotec  Plan to start Pitocin at 1:00 am, 4 hours after last dose of Cytotec.  Labor: Progressing normally Preeclampsia:  N/A Fetal Wellbeing:  Category I Pain Control:  Labor support without medications I/D:  n/a Anticipated MOD:  NSVD  LEFTWICH-KIRBY, Chaske Paskett 09/05/2011, 11:22 PM

## 2011-09-05 NOTE — Progress Notes (Signed)
Joan Orozco is a 26 y.o. G1P0000 at [redacted]w[redacted]d admitted for induction of labor due to Post dates.   Subjective: Pt is comfortable with IV pain medications, feeling some cramping at this time. Family at bedside for support.  Objective: BP 118/63  Pulse 68  Temp(Src) 98.5 F (36.9 C) (Oral)  Resp 18  Ht 5\' 3"  (1.6 m)  Wt 86.183 kg (190 lb)  BMI 33.66 kg/m2  LMP 11/25/2010      FHT:  FHR: 145 bpm, variability: moderate,  accelerations:  Present,  decelerations:  Absent UC:   Irritability  SVE:   Deferred. Tension applied to foley bulb and retaped to pt leg.    Labs: Lab Results  Component Value Date   WBC 13.3* 09/05/2011   HGB 12.6 09/05/2011   HCT 37.8 09/05/2011   MCV 91.7 09/05/2011   PLT 193 09/05/2011    Assessment / Plan: Induction of labor due to postterm,  progressing well with foley bulb and PO Cytotec  Labor: Progressing normally Preeclampsia:  N/A Fetal Wellbeing:  Category I Pain Control:  Nubain I/D:  n/a Anticipated MOD:  NSVD  LEFTWICH-KIRBY, Reggie Bise 09/05/2011, 8:53 PM

## 2011-09-05 NOTE — H&P (Signed)
Joan Orozco is a 26 y.o. female presenting for induction of labor for postdates.  +fetal movement.  Irregular contractions on monitor not felt by patient.   Maternal Medical History:  Contractions: Frequency: irregular.    Fetal activity: Perceived fetal activity is normal.   Last perceived fetal movement was within the past hour.    Prenatal complications: no prenatal complications   OB History    Grav Para Term Preterm Abortions TAB SAB Ect Mult Living   1 0 0 0 0 0 0 0 0 0      Past Medical History  Diagnosis Date  . Anxiety     at younger age. was on lexapro  . Infection     bladder, yeast, uri.  . Asthma     childhood  . Abnormal Pap smear     repeat WNL  . History of Bell's palsy    Past Surgical History  Procedure Date  . Wisdom tooth extraction   . Tooth extraction    Family History: family history is not on file.  She is adopted. Social History:  reports that she quit smoking about 6 years ago. Her smoking use included Cigarettes. She does not have any smokeless tobacco history on file. She reports that she does not drink alcohol or use illicit drugs.  ROS  Dilation: 1.5 Exam by:: in office Blood pressure 120/65, pulse 65, temperature 97.8 F (36.6 C), temperature source Oral, resp. rate 18, height 5\' 3"  (1.6 m), weight 86.183 kg (190 lb), last menstrual period 11/25/2010. Maternal Exam:  Introitus: Vagina is positive for vaginal discharge (mucusy).    Physical Exam  Constitutional: She is oriented to person, place, and time. She appears well-developed and well-nourished. No distress.  HENT:  Head: Normocephalic.  Neck: Normal range of motion. Neck supple.  Cardiovascular: Normal rate and regular rhythm.   Murmur (Grade II SEM) heard. Respiratory: Effort normal and breath sounds normal. No respiratory distress.  GI: Soft. There is no tenderness.       EFW 7.5-8lbs  Genitourinary: Vaginal discharge (mucusy) found.       1/50/-1  Neurological:  She is alert and oriented to person, place, and time.  Skin: Skin is warm and dry.    Prenatal labs: ABO, Rh: A/POS/-- (10/11 1443) Antibody: NEG (10/11 1443) Rubella: 58.8 (10/11 1443) RPR: NON REAC (01/31 1006)  HBsAg: NEGATIVE (10/11 1443)  HIV: NON REACTIVE (01/31 1006)  GBS: Negative (04/05 0000)   Assessment/Plan: Induction of Labor Post-term Pregnancy  Plan: Admit to [facility]thing Suites Insert Foley Balloon Anticipate NSVD   [facility], [place] 09/05/2011, 9:48 AM

## 2011-09-06 ENCOUNTER — Encounter (HOSPITAL_COMMUNITY): Payer: Self-pay | Admitting: Anesthesiology

## 2011-09-06 ENCOUNTER — Inpatient Hospital Stay (HOSPITAL_COMMUNITY): Payer: Medicaid Other | Admitting: Anesthesiology

## 2011-09-06 MED ORDER — GENTAMICIN SULFATE 40 MG/ML IJ SOLN
170.0000 mg | Freq: Three times a day (TID) | INTRAVENOUS | Status: DC
  Filled 2011-09-06 (×2): qty 4.25

## 2011-09-06 MED ORDER — GUAIFENESIN 100 MG/5ML PO SOLN
5.0000 mL | ORAL | Status: DC | PRN
Start: 2011-09-06 — End: 2011-09-07
  Administered 2011-09-06: 100 mg via ORAL
  Filled 2011-09-06: qty 15

## 2011-09-06 MED ORDER — ACETAMINOPHEN 500 MG PO TABS
1000.0000 mg | ORAL_TABLET | Freq: Once | ORAL | Status: AC
  Administered 2011-09-06: 1000 mg via ORAL
  Filled 2011-09-06: qty 2

## 2011-09-06 MED ORDER — LIDOCAINE HCL (PF) 1 % IJ SOLN
INTRAMUSCULAR | Status: DC | PRN
  Administered 2011-09-06 (×2): 4 mL

## 2011-09-06 MED ORDER — GENTAMICIN SULFATE 40 MG/ML IJ SOLN
180.0000 mg | Freq: Once | INTRAVENOUS | Status: AC
  Administered 2011-09-06: 180 mg via INTRAVENOUS
  Filled 2011-09-06: qty 4.5

## 2011-09-06 MED ORDER — FENTANYL 2.5 MCG/ML BUPIVACAINE 1/10 % EPIDURAL INFUSION (WH - ANES)
INTRAMUSCULAR | Status: DC | PRN
  Administered 2011-09-06: 14 mL/h via EPIDURAL

## 2011-09-06 MED ORDER — SODIUM CHLORIDE 0.9 % IV SOLN
1.0000 g | INTRAVENOUS | Status: DC
  Administered 2011-09-07: 1 g via INTRAVENOUS
  Filled 2011-09-06 (×5): qty 1000

## 2011-09-06 MED ORDER — SODIUM CHLORIDE 0.9 % IV SOLN
2.0000 g | Freq: Once | INTRAVENOUS | Status: AC
  Administered 2011-09-06: 2 g via INTRAVENOUS
  Filled 2011-09-06: qty 2000

## 2011-09-06 NOTE — Anesthesia Preprocedure Evaluation (Signed)
Anesthesia Evaluation Anesthesia Physical Anesthesia Plan  ASA: II  Anesthesia Plan: Epidural   Post-op Pain Management:    Induction:   Airway Management Planned:   Additional Equipment:   Intra-op Plan:   Post-operative Plan:   Informed Consent:   Plan Discussed with:   Anesthesia Plan Comments:         Anesthesia Quick Evaluation  

## 2011-09-06 NOTE — Progress Notes (Signed)
  Subjective: Pt reports still comfortable with Nubain, will wait for epidural.    Objective: BP 107/75  Pulse 77  Temp(Src) 98.1 F (36.7 C) (Oral)  Resp 20  Ht 5\' 3"  (1.6 m)  Wt 86.183 kg (190 lb)  BMI 33.66 kg/m2  LMP 11/25/2010      FHT:  FHR: 140's bpm, variability: moderate,  accelerations:  Present,  decelerations:  Absent UC:   irregular, every 2-4 minutes, poor tracing with toco Unable to place IUPC due to good engagement of fetal head.  SVE:   Dilation: 7.5 Effacement (%): 80 Station: 0 Exam by:: Roney Marion CNM  Labs: Lab Results  Component Value Date   WBC 13.3* 09/05/2011   HGB 12.6 09/05/2011   HCT 37.8 09/05/2011   MCV 91.7 09/05/2011   PLT 193 09/05/2011   AROM>clear fluid  Assessment / Plan: Augmentation of labor, progressing well Keep pitocin on 10 mu/min Labor:  Progressing on pitocin;   Preeclampsia:  n/a Fetal Wellbeing:  Category I Pain Control:  Nubain I/D:  n/a Anticipated MOD:  NSVD  [facility] 09/06/2011, 12:45 PM

## 2011-09-06 NOTE — Progress Notes (Signed)
   Subjective: Pt reports comfortable after getting epidural.   Objective: BP 109/72  Pulse 65  Temp(Src) 99.7 F (37.6 C) (Oral)  Resp 18  Ht 5\' 3"  (1.6 m)  Wt 86.183 kg (190 lb)  BMI 33.66 kg/m2  SpO2 100%  LMP 11/25/2010      FHT:  FHR: 130's bpm, variability: moderate,  accelerations:  Present,  decelerations:  Absent UC:   Poor tracing - 2 min SVE:   Dilation: 7 Effacement (%): 100 Station: 0 Exam by:: Roney Marion CNM  IUPC placed without difficulty  Labs: Lab Results  Component Value Date   WBC 13.3* 09/05/2011   HGB 12.6 09/05/2011   HCT 37.8 09/05/2011   MCV 91.7 09/05/2011   PLT 193 09/05/2011    Assessment / Plan: Inadequate Contractions  Labor: Inadequate Contractions Preeclampsia:  n/a Fetal Wellbeing:  Category I Pain Control:  Epidural I/D:  n/a Anticipated MOD:  NSVD  [facility] 09/06/2011, 5:02 PM

## 2011-09-06 NOTE — Progress Notes (Signed)
Joan Orozco is a 26 y.o. G1P0000 at [redacted]w[redacted]d admitted for induction of labor due to Post dates.   Subjective: Pt is comfortable without pain medications, dozing intermittently.  Objective: BP 116/52  Pulse 68  Temp(Src) 98.3 F (36.8 C) (Oral)  Resp 20  Ht 5\' 3"  (1.6 m)  Wt 86.183 kg (190 lb)  BMI 33.66 kg/m2  LMP 11/25/2010      FHT:  FHR: 135 bpm, variability: moderate,  accelerations:  Present,  decelerations:  Absent UC:   Irregular, every 3-10 minutes SVE:   Dilation: 6 Effacement (%): 80 Station: -2 Exam by:: H Stone RN  Pitocin off since 3:15 am, r/t tachysystole   Labs: Lab Results  Component Value Date   WBC 13.3* 09/05/2011   HGB 12.6 09/05/2011   HCT 37.8 09/05/2011   MCV 91.7 09/05/2011   PLT 193 09/05/2011    Assessment / Plan: Induction of labor due to postterm Restart Pitocin at this time  Labor: Progressing on Pitocin, will continue to increase then AROM Preeclampsia:  N/A Fetal Wellbeing:  Category I Pain Control:  Labor support without medications I/D:  n/a Anticipated MOD:  NSVD  LEFTWICH-KIRBY, Stormy Sabol 09/06/2011, 5:50 AM

## 2011-09-06 NOTE — Progress Notes (Signed)
ANTIBIOTIC CONSULT NOTE - INITIAL  Pharmacy Consult for Gentamicin Indication: increased WBC, increased temp 100.9 (1739)  Allergies  Allergen Reactions  . Cefdinir (Omni-Pac) Rash    RN says MD aware of allergy & ok w/ Ampicillin.    Patient Measurements: Height: 5\' 3"  (160 cm) Weight: 190 lb (86.183 kg) IBW/kg (Calculated) : 52.4  Adjusted Body Weight: =63kg  Vital Signs: Temp: 99.9 F (37.7 C) (05/11 1831) Temp src: Oral (05/11 1831) BP: 106/56 mmHg (05/11 1831) Pulse Rate: 64  (05/11 1831)   Labs:  Basename 09/05/11 0805  WBC 13.3*  HGB 12.6  PLT 193  LABCREA --  CREATININE --   CrCl is unknown because no creatinine reading has been taken.  Microbiology: No results found for this or any previous visit (from the past 720 hour(s)).  Medical History: Past Medical History  Diagnosis Date  . Anxiety     at younger age. was on lexapro  . Infection     bladder, yeast, uri.  . Asthma     childhood  . Abnormal Pap smear     repeat WNL  . History of Bell's palsy     Medications: also receiving Ampicillin 1g IV Q 4 hours  Assessment: R/O possible infection   Goal of Therapy: Peak ~ 6-8mg /L, Trough ~ < 1.5 mg/L   Plan: Load Gent 180mg  now. Maintenance Gent 170mg  IV Q8 to start at 0500 09/07/11. Check actual Serum Creatinine in AM.  Thank you,  Hovey-Rankin, Tiarra Anastacio 09/06/2011,7:32 PM

## 2011-09-06 NOTE — Progress Notes (Signed)
   Subjective: Pt continues to be comfortable with epidural.  No questions or concerns.    Objective: BP 99/52  Pulse 65  Temp(Src) 99 F (37.2 C) (Oral)  Resp 19  Ht 5\' 3"  (1.6 m)  Wt 86.183 kg (190 lb)  BMI 33.66 kg/m2  SpO2 100%  LMP 11/25/2010      FHT:  FHR: 140's bpm, variability: moderate,  accelerations:  Present,  decelerations:  Absent UC:   regular, every 2-3 minutes SVE:   Dilation: 9.5 Effacement (%): 100 Station: 0 Exam by:: Roney Marion CNM  Labs: Lab Results  Component Value Date   WBC 13.3* 09/05/2011   HGB 12.6 09/05/2011   HCT 37.8 09/05/2011   MCV 91.7 09/05/2011   PLT 193 09/05/2011    Assessment / Plan: Augmentation of labor, progressing well  Labor: Progressing normally Preeclampsia:  n/a Fetal Wellbeing:  Category I Pain Control:  Epidural I/D:  n/a Anticipated MOD:  NSVD  [facility] 09/06/2011, 9:17 PM

## 2011-09-06 NOTE — Progress Notes (Signed)
   Subjective: Pt reports increased pain with contractions.  Declines medication at this time.  Objective: BP 105/49  Pulse 67  Temp(Src) 98.4 F (36.9 C) (Oral)  Resp 20  Ht 5\' 3"  (1.6 m)  Wt 86.183 kg (190 lb)  BMI 33.66 kg/m2  LMP 11/25/2010      FHT:  FHR: 140's bpm, variability: moderate,  accelerations:  Present,  decelerations:  Absent UC:   regular, every 3-6 minutes SVE:   Dilation: 6.5 Effacement (%): 80 Station: -2 Exam by:: W. Muhammed CNM  Labs: Lab Results  Component Value Date   WBC 13.3* 09/05/2011   HGB 12.6 09/05/2011   HCT 37.8 09/05/2011   MCV 91.7 09/05/2011   PLT 193 09/05/2011    Assessment / Plan: Augmentation of labor, progressing well  Labor: Progressing on Pitocin, will continue to increase then AROM Preeclampsia:  n/a Fetal Wellbeing:  Category I Pain Control:  Labor support without medications I/D:  n/a Anticipated MOD:  NSVD  [facility] 09/06/2011, 8:55 AM

## 2011-09-06 NOTE — Progress Notes (Signed)
   Subjective: Pt continues to be comfortable after epidural.  Report given by nurse that pt temp is up to 100.9. Objective: BP 106/56  Pulse 64  Temp(Src) 99.9 F (37.7 C) (Oral)  Resp 20  Ht 5\' 3"  (1.6 m)  Wt 86.183 kg (190 lb)  BMI 33.66 kg/m2  SpO2 100%  LMP 11/25/2010      FHT:  FHR: 150's bpm, variability: moderate,  accelerations:  Present,  decelerations:  Present variables. UC:   irregular, every 1-2 minutes SVE:   Dilation: 7 Effacement (%): 100 Station: 0 Exam by:: Roney Marion CNM  Labs: Lab Results  Component Value Date   WBC 13.3* 09/05/2011   HGB 12.6 09/05/2011   HCT 37.8 09/05/2011   MCV 91.7 09/05/2011   PLT 193 09/05/2011    Assessment / Plan: Chorioamnionitis Insufficient Contractions Labor: Insufficient Contractions Preeclampsia:  n/a Fetal Wellbeing:  Category II Pain Control:  Epidural I/D:  n/a Anticipated MOD:  NSVD Rest from pitocin x 1 hour; than restart Begin Amp/Gent Give Tylenol 1 GM PO  MUHAMMAD,Khameron Gruenwald 09/06/2011, 7:56 PM

## 2011-09-06 NOTE — Anesthesia Procedure Notes (Signed)
Epidural Patient location during procedure: OB Start time: 09/06/2011 1:19 PM  Staffing Anesthesiologist: Jomarie Gellis A. Performed by: anesthesiologist   Preanesthetic Checklist Completed: patient identified, site marked, surgical consent, pre-op evaluation, timeout performed, IV checked, risks and benefits discussed and monitors and equipment checked  Epidural Patient position: sitting Prep: site prepped and draped and DuraPrep Patient monitoring: continuous pulse ox and blood pressure Approach: midline Injection technique: LOR air  Needle:  Needle type: Tuohy  Needle gauge: 17 G Needle length: 9 cm Needle insertion depth: 5 cm cm Catheter type: closed end flexible Catheter size: 19 Gauge Catheter at skin depth: 10 cm Test dose: negative and Other  Assessment Events: blood not aspirated, injection not painful, no injection resistance, negative IV test and no paresthesia  Additional Notes Patient identified. Risks and benefits discussed including failed block, incomplete  Pain control, post dural puncture headache, nerve damage, paralysis, blood pressure Changes, nausea, vomiting, reactions to medications-both toxic and allergic and post Partum back pain. All questions were answered. Patient expressed understanding and wished to proceed. Sterile technique was used throughout procedure. Epidural site was Dressed with sterile barrier dressing. No paresthesias, signs of intravascular injection Or signs of intrathecal spread were encountered.  Patient was more comfortable after the epidural was dosed. Please see RN's note for documentation of vital signs and FHR which are stable.

## 2011-09-06 NOTE — Anesthesia Preprocedure Evaluation (Signed)
Anesthesia Evaluation  Patient identified by MRN, date of birth, ID band Patient awake    Reviewed: Allergy & Precautions, H&P , Patient's Chart, lab work & pertinent test results  Airway Mallampati: III TM Distance: >3 FB Neck ROM: full    Dental No notable dental hx. (+) Teeth Intact   Pulmonary asthma ,  breath sounds clear to auscultation  Pulmonary exam normal       Cardiovascular negative cardio ROS  Rhythm:regular Rate:Normal     Neuro/Psych Anxiety Hx/o Bell's palsy    GI/Hepatic negative GI ROS, Neg liver ROS,   Endo/Other  negative endocrine ROS  Renal/GU negative Renal ROS  negative genitourinary   Musculoskeletal   Abdominal Normal abdominal exam  (+)   Peds  Hematology negative hematology ROS (+)   Anesthesia Other Findings   Reproductive/Obstetrics (+) Pregnancy                           Anesthesia Physical Anesthesia Plan  ASA: II  Anesthesia Plan: Epidural   Post-op Pain Management:    Induction:   Airway Management Planned:   Additional Equipment:   Intra-op Plan:   Post-operative Plan:   Informed Consent: I have reviewed the patients History and Physical, chart, labs and discussed the procedure including the risks, benefits and alternatives for the proposed anesthesia with the patient or authorized representative who has indicated his/her understanding and acceptance.     Plan Discussed with: Anesthesiologist  Anesthesia Plan Comments:         Anesthesia Quick Evaluation

## 2011-09-07 ENCOUNTER — Encounter (HOSPITAL_COMMUNITY): Payer: Self-pay

## 2011-09-07 DIAGNOSIS — O411090 Infection of amniotic sac and membranes, unspecified, unspecified trimester, not applicable or unspecified: Secondary | ICD-10-CM

## 2011-09-07 DIAGNOSIS — O480 Post-term pregnancy: Secondary | ICD-10-CM

## 2011-09-07 MED ORDER — PRENATAL MULTIVITAMIN CH
1.0000 | ORAL_TABLET | Freq: Every day | ORAL | Status: DC
  Administered 2011-09-07 – 2011-09-08 (×2): 1 via ORAL
  Filled 2011-09-07 (×2): qty 1

## 2011-09-07 MED ORDER — WITCH HAZEL-GLYCERIN EX PADS
1.0000 "application " | MEDICATED_PAD | CUTANEOUS | Status: DC | PRN
  Administered 2011-09-07: 1 via TOPICAL

## 2011-09-07 MED ORDER — ONDANSETRON HCL 4 MG/2ML IJ SOLN: 4.0000 mg | INTRAMUSCULAR | Status: DC | PRN

## 2011-09-07 MED ORDER — ONDANSETRON HCL 4 MG PO TABS: 4.0000 mg | ORAL_TABLET | ORAL | Status: DC | PRN

## 2011-09-07 MED ORDER — PNEUMOCOCCAL VAC POLYVALENT 25 MCG/0.5ML IJ INJ
0.5000 mL | INJECTION | INTRAMUSCULAR | Status: AC
  Administered 2011-09-08: 0.5 mL via INTRAMUSCULAR
  Filled 2011-09-07: qty 0.5

## 2011-09-07 MED ORDER — METHYLERGONOVINE MALEATE 0.2 MG/ML IJ SOLN
INTRAMUSCULAR | Status: AC
  Administered 2011-09-07: 0.2 mg
  Filled 2011-09-07: qty 1

## 2011-09-07 MED ORDER — LANOLIN HYDROUS EX OINT: TOPICAL_OINTMENT | CUTANEOUS | Status: DC | PRN

## 2011-09-07 MED ORDER — GUAIFENESIN 100 MG/5ML PO SOLN
5.0000 mL | ORAL | Status: DC | PRN
  Administered 2011-09-07: 100 mg via ORAL
  Administered 2011-09-08: 01:00:00 via ORAL
  Filled 2011-09-07 (×2): qty 15

## 2011-09-07 MED ORDER — SIMETHICONE 80 MG PO CHEW: 80.0000 mg | CHEWABLE_TABLET | ORAL | Status: DC | PRN

## 2011-09-07 MED ORDER — DIPHENHYDRAMINE HCL 25 MG PO CAPS: 25.0000 mg | ORAL_CAPSULE | Freq: Four times a day (QID) | ORAL | Status: DC | PRN

## 2011-09-07 MED ORDER — GUAIFENESIN-CODEINE 100-10 MG/5ML PO SOLN
5.0000 mL | ORAL | Status: DC | PRN
  Administered 2011-09-07: 5 mL via ORAL
  Filled 2011-09-07: qty 5

## 2011-09-07 MED ORDER — OXYCODONE-ACETAMINOPHEN 5-325 MG PO TABS: 1.0000 | ORAL_TABLET | ORAL | Status: DC | PRN

## 2011-09-07 MED ORDER — ZOLPIDEM TARTRATE 5 MG PO TABS: 5.0000 mg | ORAL_TABLET | Freq: Every evening | ORAL | Status: DC | PRN

## 2011-09-07 MED ORDER — MISOPROSTOL 200 MCG PO TABS
ORAL_TABLET | ORAL | Status: AC
  Administered 2011-09-07: 800 ug via RECTAL
  Filled 2011-09-07: qty 4

## 2011-09-07 MED ORDER — BENZOCAINE-MENTHOL 20-0.5 % EX AERO
1.0000 "application " | INHALATION_SPRAY | CUTANEOUS | Status: DC | PRN
  Administered 2011-09-07: 1 via TOPICAL
  Filled 2011-09-07: qty 56

## 2011-09-07 MED ORDER — TETANUS-DIPHTH-ACELL PERTUSSIS 5-2.5-18.5 LF-MCG/0.5 IM SUSP: 0.5000 mL | Freq: Once | INTRAMUSCULAR | Status: DC

## 2011-09-07 MED ORDER — IBUPROFEN 600 MG PO TABS
600.0000 mg | ORAL_TABLET | Freq: Four times a day (QID) | ORAL | Status: DC
  Administered 2011-09-07 – 2011-09-08 (×5): 600 mg via ORAL
  Filled 2011-09-07 (×5): qty 1

## 2011-09-07 MED ORDER — SENNOSIDES-DOCUSATE SODIUM 8.6-50 MG PO TABS
2.0000 | ORAL_TABLET | Freq: Every day | ORAL | Status: DC
  Administered 2011-09-08: 2 via ORAL

## 2011-09-07 MED ORDER — ACETAMINOPHEN 500 MG PO TABS
1000.0000 mg | ORAL_TABLET | Freq: Once | ORAL | Status: AC
  Administered 2011-09-07: 1000 mg via ORAL
  Filled 2011-09-07: qty 2

## 2011-09-07 MED ORDER — DIBUCAINE 1 % RE OINT
1.0000 "application " | TOPICAL_OINTMENT | RECTAL | Status: DC | PRN
  Administered 2011-09-07: 1 via RECTAL
  Filled 2011-09-07: qty 28

## 2011-09-07 NOTE — Progress Notes (Addendum)
  Subjective: Pt sleeping with epidural.  Objective: BP 109/66  Pulse 69  Temp(Src) 98.7 F (37.1 C) (Oral)  Resp 20  Ht 5\' 3"  (1.6 m)  Wt 86.183 kg (190 lb)  BMI 33.66 kg/m2  SpO2 100%  LMP 11/25/2010      FHT:  FHR: 130's bpm, variability: minimal ,  accelerations:  Abscent,  decelerations:  Present early decels UC:   regular, every 2-3 minutes SVE:   Dilation: Lip/rim Effacement (%): 100 Station: +1 Exam by:: Danella Deis, RNC-OB, C-EFM  Labs: Lab Results  Component Value Date   WBC 13.3* 09/05/2011   HGB 12.6 09/05/2011   HCT 37.8 09/05/2011   MCV 91.7 09/05/2011   PLT 193 09/05/2011    Assessment / Plan: Augmentation of labor, progressing well  Labor: Progressing normally Preeclampsia:  n/a Fetal Wellbeing:  Category II Pain Control:  Epidural I/D:  n/a Anticipated MOD:  NSVD  Begin pushing soon once complete.  [facility] 09/07/2011, 12:29 AM

## 2011-09-07 NOTE — Progress Notes (Signed)
Notified Dr. Konrad Dolores patient would like a cough medicine without codeine in it.

## 2011-09-07 NOTE — Progress Notes (Signed)
Transferred to 119 via stretcher

## 2011-09-07 NOTE — Anesthesia Postprocedure Evaluation (Signed)
Anesthesia Post Note  Patient: Joan Orozco  Procedure(s) Performed: * No procedures listed *  Anesthesia type: Epidural  Patient location: Mother/Baby  Post pain: Pain level controlled  Post assessment: Post-op Vital signs reviewed  Last Vitals:  Filed Vitals:   09/07/11 0702  BP: 101/64  Pulse:   Temp: 37 C  Resp:     Post vital signs: Reviewed  Level of consciousness: awake  Complications: No apparent anesthesia complications

## 2011-09-08 DIAGNOSIS — S3763XA Laceration of uterus, initial encounter: Secondary | ICD-10-CM | POA: Diagnosis not present

## 2011-09-08 LAB — CBC
MCV: 91.6 fL (ref 78.0–100.0)
Platelets: 141 10*3/uL — ABNORMAL LOW (ref 150–400)
RBC: 2.51 MIL/uL — ABNORMAL LOW (ref 3.87–5.11)
WBC: 15.9 10*3/uL — ABNORMAL HIGH (ref 4.0–10.5)

## 2011-09-08 MED ORDER — IBUPROFEN 600 MG PO TABS: 600.0000 mg | ORAL_TABLET | Freq: Four times a day (QID) | ORAL | Status: AC

## 2011-09-08 MED ORDER — GUAIFENESIN 100 MG/5ML PO SOLN: 5.0000 mL | ORAL | Status: DC | PRN

## 2011-09-08 NOTE — Progress Notes (Signed)
Post Partum Day1 Subjective: no complaints  Objective: Blood pressure 116/69, pulse 75, temperature 97.2 F (36.2 C), temperature source Oral, resp. rate 18, height 5\' 3"  (1.6 m), weight 190 lb (86.183 kg), last menstrual period 11/25/2010, SpO2 98.00%, unknown if currently breastfeeding.  Physical Exam:  General: alert, cooperative and no distress Lochia: appropriate Uterine Fundus: firm Incision: perineum healing well DVT Evaluation: No evidence of DVT seen on physical exam.   Basename 09/08/11 0500 09/05/11 0805  HGB 7.7* 12.6  HCT 23.0* 37.8    Assessment/Plan: Plan for discharge tomorrow, Breastfeeding and Lactation consult   LOS: 3 days   Morrissa Shein E. 09/08/2011, 7:26 AM

## 2011-09-08 NOTE — Progress Notes (Signed)

## 2011-09-08 NOTE — Progress Notes (Signed)
UR chart review completed.  

## 2011-09-08 NOTE — H&P (Signed)
Agree with above note.  Godwin Tedesco 09/08/2011 12:22 PM

## 2011-09-08 NOTE — Discharge Summary (Signed)
Attestation of Attending Supervision of Advanced Practitioner: Evaluation and management procedures were performed by the OB Fellow/PA/CNM/NP under my supervision and collaboration. Chart reviewed, and agree with management and plan.  Meyli Boice, M.D. 09/08/2011 12:07 PM   

## 2011-09-08 NOTE — Discharge Instructions (Signed)

## 2011-09-08 NOTE — Discharge Summary (Signed)
Obstetric Discharge Summary Reason for Admission: induction of labor Prenatal Procedures: NST Intrapartum Procedures: spontaneous vaginal delivery Postpartum Procedures: Repair of cervical laceration Complications-Operative and Postpartum: none Hemoglobin  Date Value Range Status  09/08/2011 7.7* 12.0-15.0 (g/dL) Final     HCT  Date Value Range Status  09/08/2011 23.0* 36.0-46.0 (%) Final   Hospital Course: This pt was admitted for Induction of labor secondary to postdates. Foley and pitocin were used. She developed a fever intrapartum and was treated with antibiotics.  She progressed well to SVD Delivery Note At 3:40 AM a viable and healthy female was delivered via Vaginal, Spontaneous Delivery (Presentation: ROA).  Dr. Eric Form (neonatologist) present for delivery due to chorioamnionitis and particulate meconium.  Spontaneous cry at delivery.  APGAR: 8, 9; weight 8 lb 8.2 oz (3861 g).   Placenta status: Intact, Spontaneous.  Cord: 3 vessels with the following complications: .  Cord pH: n/a  Pt continued to have rapid bleeding after delivery; cytotec placed immediately in rectum; bladder drained with catheter, proceeded with IM methergine.  After inspecting perineum and no lacerations noted and uterus palpating firm.  Dr. Jolayne Panther called for a suspected cervical laceration.   Anesthesia: Epidural  Episiotomy: None Lacerations: Posterior cervical laceration Suture Repair: 3.0 monocryl x 2 repaired by Dr. Jolayne Panther   Est. Blood Loss (mL): 650  Mom to postpartum.  Baby to nursery-stable.  She has done well postpartum except for a low Hemoglobin but has been hemodynamically stable and asymptomatic. She desires early discharge. Results for orders placed during the hospital encounter of 09/05/11 (from the past 24 hour(s))  CBC     Status: Abnormal   Collection Time   09/08/11  5:00 AM      Component Value Range   WBC 15.9 (*) 4.0 - 10.5 (K/uL)   RBC 2.51 (*) 3.87 - 5.11 (MIL/uL)   Hemoglobin 7.7 (*) 12.0 - 15.0 (g/dL)   HCT 16.1 (*) 09.6 - 46.0 (%)   MCV 91.6  78.0 - 100.0 (fL)   MCH 30.7  26.0 - 34.0 (pg)   MCHC 33.5  30.0 - 36.0 (g/dL)   RDW 04.5  40.9 - 81.1 (%)   Platelets 141 (*) 150 - 400 (K/uL)     Physical Exam:  General: alert and no distress Lochia: appropriate Uterine Fundus: firm Incision: healing well DVT Evaluation: No evidence of DVT seen on physical exam.  Discharge Diagnoses: Term Pregnancy-delivered  Discharge Information: Date: 09/08/2011 Activity: unrestricted and pelvic rest Diet: routine Medications: Ibuprofen Condition: stable Instructions: refer to practice specific booklet Discharge to: home   Newborn Data: Live born female  Birth Weight: 8 lb 8.2 oz (3861 g) APGAR: 8, 9  Home with mother.  [facility] 09/08/2011, 10:11 AM

## 2011-09-16 ENCOUNTER — Ambulatory Visit (HOSPITAL_COMMUNITY): Payer: Medicaid Other

## 2011-10-21 ENCOUNTER — Ambulatory Visit (INDEPENDENT_AMBULATORY_CARE_PROVIDER_SITE_OTHER): Payer: Medicaid Other | Admitting: Obstetrics & Gynecology

## 2011-10-21 ENCOUNTER — Encounter: Payer: Self-pay | Admitting: Obstetrics & Gynecology

## 2011-10-21 NOTE — Progress Notes (Signed)
  Subjective:    Patient ID: Joan Orozco, female    DOB: [birth year], 26 y.o.   MRN: [id number]  HPI  She is now 6 weeks pp after NSVD. No complaints except tired. She is breast feeding easily. Baby is growing and sleeping well. She denies bowel or bladder dysfunction or pp depression.  Review of Systems     Objective:   Physical Exam  Normal vulva/vagina/cervix Involuted uterus, NSSR, NT, no adnexal masses      Assessment & Plan:   Pp- doing well. RTC annual 10/13

## 2012-12-02 ENCOUNTER — Other Ambulatory Visit: Payer: Self-pay | Admitting: Obstetrics and Gynecology

## 2012-12-02 ENCOUNTER — Encounter: Payer: Self-pay | Admitting: Obstetrics and Gynecology

## 2012-12-02 ENCOUNTER — Ambulatory Visit (INDEPENDENT_AMBULATORY_CARE_PROVIDER_SITE_OTHER): Payer: Medicaid Other | Admitting: Obstetrics and Gynecology

## 2012-12-02 VITALS — BP 91/50 | Wt 148.0 lb

## 2012-12-02 DIAGNOSIS — Z124 Encounter for screening for malignant neoplasm of cervix: Secondary | ICD-10-CM

## 2012-12-02 DIAGNOSIS — Z3480 Encounter for supervision of other normal pregnancy, unspecified trimester: Secondary | ICD-10-CM | POA: Insufficient documentation

## 2012-12-02 DIAGNOSIS — Z3682 Encounter for antenatal screening for nuchal translucency: Secondary | ICD-10-CM

## 2012-12-02 DIAGNOSIS — Z3481 Encounter for supervision of other normal pregnancy, first trimester: Secondary | ICD-10-CM

## 2012-12-02 DIAGNOSIS — G43909 Migraine, unspecified, not intractable, without status migrainosus: Secondary | ICD-10-CM | POA: Insufficient documentation

## 2012-12-02 DIAGNOSIS — Z113 Encounter for screening for infections with a predominantly sexual mode of transmission: Secondary | ICD-10-CM

## 2012-12-02 NOTE — Progress Notes (Signed)
   Subjective:    Joan Orozco is a G2P1001 [redacted]w[redacted]d being seen today for her first obstetrical visit.  Her obstetrical history is significant for history of migraine. Patient does intend to breast feed. Pregnancy history fully reviewed.  Patient reports nausea. Her migraines occur very rarely and thus she rarely takes Imitrex to manage her headaches.  Filed Vitals:   12/02/12 1051  BP: 91/50  Weight: 148 lb (67.132 kg)    HISTORY: OB History   Grav Para Term Preterm Abortions TAB SAB Ect Mult Living   2 1 1  0 0 0 0 0 0 1     # Outc Date GA Lbr Len/2nd Wgt Sex Del Anes PTL Lv   1 TRM 5/13 [redacted]w[redacted]d 20:01 / 02:40 8lb8.2oz(3.861kg) M SVD EPI  Yes   Comments: hematoma to umblicial cord   2 CUR              Past Medical History  Diagnosis Date  . Anxiety     at younger age. was on lexapro  . Infection     bladder, yeast, uri.  . Asthma     childhood  . Abnormal Pap smear     repeat WNL  . History of Bell's palsy    Past Surgical History  Procedure Laterality Date  . Wisdom tooth extraction    . Tooth extraction     Family History  Problem Relation Age of Onset  . Adopted: Yes     Exam    Uterus:     Pelvic Exam:    Perineum: Normal Perineum   Vulva: normal   Vagina:  normal mucosa, normal discharge   pH:    Cervix: closed and long   Adnexa: no mass, fullness, tenderness   Bony Pelvis: android  System: Breast:  normal appearance, no masses or tenderness   Skin: normal coloration and turgor, no rashes    Neurologic: oriented, no focal deficits   Extremities: normal strength, tone, and muscle mass   HEENT extra ocular movement intact   Mouth/Teeth mucous membranes moist, pharynx normal without lesions   Neck supple and no masses   Cardiovascular: regular rate and rhythm   Respiratory:  chest clear, no wheezing, crepitations, rhonchi, normal symmetric air entry   Abdomen: soft, non-tender; bowel sounds normal; no masses,  no organomegaly   Urinary:        Assessment:    Pregnancy: G2P1001 Patient Active Problem List   Diagnosis Date Noted  . Supervision of other normal pregnancy 12/02/2012    Priority: Medium  . Migraine 12/02/2012    Priority: Medium        Plan:     Initial labs drawn. Prenatal vitamins. Problem list reviewed and updated. Genetic Screening discussed First Screen: requested.  Ultrasound discussed; fetal survey: requested.  Follow up in 4 weeks. 50% of 30 min visit spent on counseling and coordination of care.     Kalin Kyler 12/02/2012

## 2012-12-02 NOTE — Addendum Note (Signed)
Addended by: Barbara Cower on: 12/02/2012 11:35 AM   Modules accepted: Orders

## 2012-12-02 NOTE — Patient Instructions (Signed)
Pregnancy - First Trimester During sexual intercourse, millions of sperm go into the vagina. Only 1 sperm will penetrate and fertilize the female egg while it is in the Fallopian tube. One week later, the fertilized egg implants into the wall of the uterus. An embryo begins to develop into a baby. At 6 to 8 weeks, the eyes and face are formed and the heartbeat can be seen on ultrasound. At the end of 12 weeks (first trimester), all the baby's organs are formed. Now that you are pregnant, you will want to do everything you can to have a healthy baby. Two of the most important things are to get good prenatal care and follow your caregiver's instructions. Prenatal care is all the medical care you receive before the baby's birth. It is given to prevent, find, and treat problems during the pregnancy and childbirth. PRENATAL EXAMS  During prenatal visits, your weight, blood pressure, and urine are checked. This is done to make sure you are healthy and progressing normally during the pregnancy.  A pregnant woman should gain 25 to 35 pounds during the pregnancy. However, if you are overweight or underweight, your caregiver will advise you regarding your weight.  Your caregiver will ask and answer questions for you.  Blood work, cervical cultures, other necessary tests, and a Pap test are done during your prenatal exams. These tests are done to check on your health and the probable health of your baby. Tests are strongly recommended and done for HIV with your permission. This is the virus that causes AIDS. These tests are done because medicines can be given to help prevent your baby from being born with this infection should you have been infected without knowing it. Blood work is also used to find out your blood type, previous infections, and follow your blood levels (hemoglobin).  Low hemoglobin (anemia) is common during pregnancy. Iron and vitamins are given to help prevent this. Later in the pregnancy,  blood tests for diabetes will be done along with any other tests if any problems develop.  You may need other tests to make sure you and the baby are doing well. CHANGES DURING THE FIRST TRIMESTER  Your body goes through many changes during pregnancy. They vary from person to person. Talk to your caregiver about changes you notice and are concerned about. Changes can include:  Your menstrual period stops.  The egg and sperm carry the genes that determine what you look like. Genes from you and your partner are forming a baby. The female genes determine whether the baby is a boy or a girl.  Your body increases in girth and you may feel bloated.  Feeling sick to your stomach (nauseous) and throwing up (vomiting). If the vomiting is uncontrollable, call your caregiver.  Your breasts will begin to enlarge and become tender.  Your nipples may stick out more and become darker.  The need to urinate more. Painful urination may mean you have a bladder infection.  Tiring easily.  Loss of appetite.  Cravings for certain kinds of food.  At first, you may gain or lose a couple of pounds.  You may have changes in your emotions from day to day (excited to be pregnant or concerned something may go wrong with the pregnancy and baby).  You may have more vivid and strange dreams. HOME CARE INSTRUCTIONS   It is very important to avoid all smoking, alcohol and non-prescribed drugs during your pregnancy. These affect the formation and growth of the baby.   Avoid chemicals while pregnant to ensure the delivery of a healthy infant.  Start your prenatal visits by the 12th week of pregnancy. They are usually scheduled monthly at first, then more often in the last 2 months before delivery. Keep your caregiver's appointments. Follow your caregiver's instructions regarding medicine use, blood and lab tests, exercise, and diet.  During pregnancy, you are providing food for you and your baby. Eat regular,  well-balanced meals. Choose foods such as meat, fish, milk and other low fat dairy products, vegetables, fruits, and whole-grain breads and cereals. Your caregiver will tell you of the ideal weight gain.  You can help morning sickness by keeping soda crackers at the bedside. Eat a couple before arising in the morning. You may want to use the crackers without salt on them.  Eating 4 to 5 small meals rather than 3 large meals a day also may help the nausea and vomiting.  Drinking liquids between meals instead of during meals also seems to help nausea and vomiting.  A physical sexual relationship may be continued throughout pregnancy if there are no other problems. Problems may be early (premature) leaking of amniotic fluid from the membranes, vaginal bleeding, or belly (abdominal) pain.  Exercise regularly if there are no restrictions. Check with your caregiver or physical therapist if you are unsure of the safety of some of your exercises. Greater weight gain will occur in the last 2 trimesters of pregnancy. Exercising will help:  Control your weight.  Keep you in shape.  Prepare you for labor and delivery.  Help you lose your pregnancy weight after you deliver your baby.  Wear a good support or jogging bra for breast tenderness during pregnancy. This may help if worn during sleep too.  Ask when prenatal classes are available. Begin classes when they are offered.  Do not use hot tubs, steam rooms, or saunas.  Wear your seat belt when driving. This protects you and your baby if you are in an accident.  Avoid raw meat, uncooked cheese, cat litter boxes, and soil used by cats throughout the pregnancy. These carry germs that can cause birth defects in the baby.  The first trimester is a good time to visit your dentist for your dental health. Getting your teeth cleaned is okay. Use a softer toothbrush and brush gently during pregnancy.  Ask for help if you have financial, counseling, or  nutritional needs during pregnancy. Your caregiver will be able to offer counseling for these needs as well as refer you for other special needs.  Do not take any medicines or herbs unless told by your caregiver.  Inform your caregiver if there is any mental or physical domestic violence.  Make a list of emergency phone numbers of family, friends, hospital, and police and fire departments.  Write down your questions. Take them to your prenatal visit.  Do not douche.  Do not cross your legs.  If you have to stand for long periods of time, rotate you feet or take small steps in a circle.  You may have more vaginal secretions that may require a sanitary pad. Do not use tampons or scented sanitary pads. MEDICINES AND DRUG USE IN PREGNANCY  Take prenatal vitamins as directed. The vitamin should contain 1 milligram of folic acid. Keep all vitamins out of reach of children. Only a couple vitamins or tablets containing iron may be fatal to a baby or young child when ingested.  Avoid use of all medicines, including herbs, over-the-counter medicines, not   prescribed or suggested by your caregiver. Only take over-the-counter or prescription medicines for pain, discomfort, or fever as directed by your caregiver. Do not use aspirin, ibuprofen, or naproxen unless directed by your caregiver.  Let your caregiver also know about herbs you may be using.  Alcohol is related to a number of birth defects. This includes fetal alcohol syndrome. All alcohol, in any form, should be avoided completely. Smoking will cause low birth rate and premature babies.  Street or illegal drugs are very harmful to the baby. They are absolutely forbidden. A baby born to an addicted mother will be addicted at birth. The baby will go through the same withdrawal an adult does.  Let your caregiver know about any medicines that you have to take and for what reason you take them. SEEK MEDICAL CARE IF:  You have any concerns or  worries during your pregnancy. It is better to call with your questions if you feel they cannot wait, rather than worry about them. SEEK IMMEDIATE MEDICAL CARE IF:   An unexplained oral temperature above 102 F (38.9 C) develops, or as your caregiver suggests.  You have leaking of fluid from the vagina (birth canal). If leaking membranes are suspected, take your temperature and inform your caregiver of this when you call.  There is vaginal spotting or bleeding. Notify your caregiver of the amount and how many pads are used.  You develop a bad smelling vaginal discharge with a change in the color.  You continue to feel sick to your stomach (nauseated) and have no relief from remedies suggested. You vomit blood or coffee ground-like materials.  You lose more than 2 pounds of weight in 1 week.  You gain more than 2 pounds of weight in 1 week and you notice swelling of your face, hands, feet, or legs.  You gain 5 pounds or more in 1 week (even if you do not have swelling of your hands, face, legs, or feet).  You get exposed to German measles and have never had them.  You are exposed to fifth disease or chickenpox.  You develop belly (abdominal) pain. Round ligament discomfort is a common non-cancerous (benign) cause of abdominal pain in pregnancy. Your caregiver still must evaluate this.  You develop headache, fever, diarrhea, pain with urination, or shortness of breath.  You fall or are in a car accident or have any kind of trauma.  There is mental or physical violence in your home. Document Released: 04/08/2001 Document Revised: 01/07/2012 Document Reviewed: 10/10/2008 [employer] Patient Information 2014 [employer].  Contraception Choices Contraception (birth control) is the use of any methods or devices to prevent pregnancy. Below are some methods to help avoid pregnancy. HORMONAL METHODS   Contraceptive implant. This is a thin, plastic tube containing progesterone hormone. It  does not contain estrogen hormone. Your caregiver inserts the tube in the inner part of the upper arm. The tube can remain in place for up to 3 years. After 3 years, the implant must be removed. The implant prevents the ovaries from releasing an egg (ovulation), thickens the cervical mucus which prevents sperm from entering the uterus, and thins the lining of the inside of the uterus.  Progesterone-only injections. These injections are given every 3 months by your caregiver to prevent pregnancy. This synthetic progesterone hormone stops the ovaries from releasing eggs. It also thickens cervical mucus and changes the uterine lining. This makes it harder for sperm to survive in the uterus.  Birth control pills. These pills   contain estrogen and progesterone hormone. They work by stopping the egg from forming in the ovary (ovulation). Birth control pills are prescribed by a caregiver.Birth control pills can also be used to treat heavy periods.  Minipill. This type of birth control pill contains only the progesterone hormone. They are taken every day of each month and must be prescribed by your caregiver.  Birth control patch. The patch contains hormones similar to those in birth control pills. It must be changed once a week and is prescribed by a caregiver.  Vaginal ring. The ring contains hormones similar to those in birth control pills. It is left in the vagina for 3 weeks, removed for 1 week, and then a new one is put back in place. The patient must be comfortable inserting and removing the ring from the vagina.A caregiver's prescription is necessary.  Emergency contraception. Emergency contraceptives prevent pregnancy after unprotected sexual intercourse. This pill can be taken right after sex or up to 5 days after unprotected sex. It is most effective the sooner you take the pills after having sexual intercourse. Emergency contraceptive pills are available without a prescription. Check with your  pharmacist. Do not use emergency contraception as your only form of birth control. BARRIER METHODS   Female condom. This is a thin sheath (latex or rubber) that is worn over the penis during sexual intercourse. It can be used with spermicide to increase effectiveness.  Female condom. This is a soft, loose-fitting sheath that is put into the vagina before sexual intercourse.  Diaphragm. This is a soft, latex, dome-shaped barrier that must be fitted by a caregiver. It is inserted into the vagina, along with a spermicidal jelly. It is inserted before intercourse. The diaphragm should be left in the vagina for 6 to 8 hours after intercourse.  Cervical cap. This is a round, soft, latex or plastic cup that fits over the cervix and must be fitted by a caregiver. The cap can be left in place for up to 48 hours after intercourse.  Sponge. This is a soft, circular piece of polyurethane foam. The sponge has spermicide in it. It is inserted into the vagina after wetting it and before sexual intercourse.  Spermicides. These are chemicals that kill or block sperm from entering the cervix and uterus. They come in the form of creams, jellies, suppositories, foam, or tablets. They do not require a prescription. They are inserted into the vagina with an applicator before having sexual intercourse. The process must be repeated every time you have sexual intercourse. INTRAUTERINE CONTRACEPTION  Intrauterine device (IUD). This is a T-shaped device that is put in a woman's uterus during a menstrual period to prevent pregnancy. There are 2 types:  Copper IUD. This type of IUD is wrapped in copper wire and is placed inside the uterus. Copper makes the uterus and fallopian tubes produce a fluid that kills sperm. It can stay in place for 10 years.  Hormone IUD. This type of IUD contains the hormone progestin (synthetic progesterone). The hormone thickens the cervical mucus and prevents sperm from entering the uterus, and it  also thins the uterine lining to prevent implantation of a fertilized egg. The hormone can weaken or kill the sperm that get into the uterus. It can stay in place for 5 years. PERMANENT METHODS OF CONTRACEPTION  Female tubal ligation. This is when the woman's fallopian tubes are surgically sealed, tied, or blocked to prevent the egg from traveling to the uterus.  Female sterilization. This is when   the female has the tubes that carry sperm tied off (vasectomy).This blocks sperm from entering the vagina during sexual intercourse. After the procedure, the man can still ejaculate fluid (semen). NATURAL PLANNING METHODS  Natural family planning. This is not having sexual intercourse or using a barrier method (condom, diaphragm, cervical cap) on days the woman could become pregnant.  Calendar method. This is keeping track of the length of each menstrual cycle and identifying when you are fertile.  Ovulation method. This is avoiding sexual intercourse during ovulation.  Symptothermal method. This is avoiding sexual intercourse during ovulation, using a thermometer and ovulation symptoms.  Post-ovulation method. This is timing sexual intercourse after you have ovulated. Regardless of which type or method of contraception you choose, it is important that you use condoms to protect against the transmission of sexually transmitted diseases (STDs). Talk with your caregiver about which form of contraception is most appropriate for you. Document Released: 04/14/2005 Document Revised: 07/07/2011 Document Reviewed: 08/21/2010 [employer] Patient Information 2014 [employer].  Breastfeeding A change in hormones during your pregnancy causes growth of your breast tissue and an increase in number and size of milk ducts. The hormone prolactin allows proteins, sugars, and fats from your blood supply to make breast milk in your milk-producing glands. The hormone progesterone prevents breast milk from being released  before the birth of your baby. After the birth of your baby, your progesterone level decreases allowing breast milk to be released. Thoughts of your baby, as well as his or her sucking or crying, can stimulate the release of milk from the milk-producing glands. Deciding to breastfeed (nurse) is one of the best choices you can make for you and your baby. The information that follows gives a brief review of the benefits, as well as other important skills to know about breastfeeding. BENEFITS OF BREASTFEEDING For your baby  The first milk (colostrum) helps your baby's digestive system function better.   There are antibodies in your milk that help your baby fight off infections.   Your baby has a lower incidence of asthma, allergies, and sudden infant death syndrome (SIDS).   The nutrients in breast milk are better for your baby than infant formulas.  Breast milk improves your baby's brain development.   Your baby will have less gas, colic, and constipation.  Your baby is less likely to develop other conditions, such as childhood obesity, asthma, or diabetes mellitus. For you  Breastfeeding helps develop a very special bond between you and your baby.   Breastfeeding is convenient, always available at the correct temperature, and costs nothing.   Breastfeeding helps to burn calories and helps you lose the weight gained during pregnancy.   Breastfeeding makes your uterus contract back down to normal size faster and slows bleeding following delivery.   Breastfeeding mothers have a lower risk of developing osteoporosis or breast or ovarian cancer later in life.  BREASTFEEDING FREQUENCY  A healthy, full-term baby may breastfeed as often as every hour or space his or her feedings to every 3 hours. Breastfeeding frequency will vary from baby to baby.   Newborns should be fed no less than every 2 3 hours during the day and every 4 5 hours during the night. You should breastfeed a  minimum of 8 feedings in a 24 hour period.  Awaken your baby to breastfeed if it has been 3 4 hours since the last feeding.  Breastfeed when you feel the need to reduce the fullness of your breasts or when   your newborn shows signs of hunger. Signs that your baby may be hungry include:  Increased alertness or activity.  Stretching.  Movement of the head from side to side.  Movement of the head and opening of the mouth when the corner of the mouth or cheek is stroked (rooting).  Increased sucking sounds, smacking lips, cooing, sighing, or squeaking.  Hand-to-mouth movements.  Increased sucking of fingers or hands.  Fussing.  Intermittent crying.  Signs of extreme hunger will require calming and consoling before you try to feed your baby. Signs of extreme hunger may include:  Restlessness.  A loud, strong cry.  Screaming.  Frequent feeding will help you make more milk and will help prevent problems, such as sore nipples and engorgement of the breasts.  BREASTFEEDING   Whether lying down or sitting, be sure that the baby's abdomen is facing your abdomen.   Support your breast with 4 fingers under your breast and your thumb above your nipple. Make sure your fingers are well away from your nipple and your baby's mouth.   Stroke your baby's lips gently with your finger or nipple.   When your baby's mouth is open wide enough, place all of your nipple and as much of the colored area around your nipple (areola) as possible into your baby's mouth.  More areola should be visible above his or her upper lip than below his or her lower lip.  Your baby's tongue should be between his or her lower gum and your breast.  Ensure that your baby's mouth is correctly positioned around the nipple (latched). Your baby's lips should create a seal on your breast.  Signs that your baby has effectively latched onto your nipple include:  Tugging or sucking without pain.  Swallowing heard  between sucks.  Absent click or smacking sound.  Muscle movement above and in front of his or her ears with sucking.  Your baby must suck about 2 3 minutes in order to get your milk. Allow your baby to feed on each breast as long as he or she wants. Nurse your baby until he or she unlatches or falls asleep at the first breast, then offer the second breast.  Signs that your baby is full and satisfied include:  A gradual decrease in the number of sucks or complete cessation of sucking.  Falling asleep.  Extension or relaxation of his or her body.  Retention of a small amount of milk in his or her mouth.  Letting go of your breast by himself or herself.  Signs of effective breastfeeding in you include:  Breasts that have increased firmness, weight, and size prior to feeding.  Breasts that are softer after nursing.  Increased milk volume, as well as a change in milk consistency and color by the 5th day of breastfeeding.  Breast fullness relieved by breastfeeding.  Nipples are not sore, cracked, or bleeding.  If needed, break the suction by putting your finger into the corner of your baby's mouth and sliding your finger between his or her gums. Then, remove your breast from his or her mouth.  It is common for babies to spit up a small amount after a feeding.  Babies often swallow air during feeding. This can make babies fussy. Burping your baby between breasts can help with this.  Vitamin D supplements are recommended for babies who get only breast milk.  Avoid using a pacifier during your baby's first 4 6 weeks.  Avoid supplemental feedings of water, formula, or   juice in place of breastfeeding. Breast milk is all the food your baby needs. It is not necessary for your baby to have water or formula. Your breasts will make more milk if supplemental feedings are avoided during the early weeks. HOW TO TELL WHETHER YOUR BABY IS GETTING ENOUGH BREAST MILK Wondering whether or not  your baby is getting enough milk is a common concern among mothers. You can be assured that your baby is getting enough milk if:   Your baby is actively sucking and you hear swallowing.   Your baby seems relaxed and satisfied after a feeding.   Your baby nurses at least 8 12 times in a 24 hour time period.  During the first 3 5 days of age:  Your baby is wetting at least 3 5 diapers in a 24 hour period. The urine should be clear and pale yellow.  Your baby is having at least 3 4 stools in a 24 hour period. The stool should be soft and yellow.  At 5 7 days of age, your baby is having at least 3 6 stools in a 24 hour period. The stool should be seedy and yellow by 5 days of age.  Your baby has a weight loss less than 7 10% during the first 3 days of age.  Your baby does not lose weight after 3 7 days of age.  Your baby gains 4 7 ounces each week after he or she is 4 days of age.  Your baby gains weight by 5 days of age and is back to birth weight within 2 weeks. ENGORGEMENT In the first week after your baby is born, you may experience extremely full breasts (engorgement). When engorged, your breasts may feel heavy, warm, or tender to the touch. Engorgement peaks within 24 48 hours after delivery of your baby.  Engorgement may be reduced by:  Continuing to breastfeed.  Increasing the frequency of breastfeeding.  Taking warm showers or applying warm, moist heat to your breasts just before each feeding. This increases circulation and helps the milk flow.   Gently massaging your breast before and during the feedings. With your fingertips, massage from your chest wall towards your nipple in a circular motion.   Ensuring that your baby empties at least one breast at every feeding. It also helps to start the next feeding on the opposite breast.   Expressing breast milk by hand or by using a breast pump to empty the breasts if your baby is sleepy, or not nursing well. You may also  want to express milk if you are returning to work oryou feel you are getting engorged.  Ensuring your baby is latched on and positioned properly while breastfeeding. If you follow these suggestions, your engorgement should improve in 24 48 hours. If you are still experiencing difficulty, call your lactation consultant or caregiver.  CARING FOR YOURSELF Take care of your breasts.  Bathe or shower daily.   Avoid using soap on your nipples.   Wear a supportive bra. Avoid wearing underwire style bras.  Air dry your nipples for a 3 4minutes after each feeding.   Use only cotton bra pads to absorb breast milk leakage. Leaking of breast milk between feedings is normal.   Use only pure lanolin on your nipples after nursing. You do not need to wash it off before feeding your baby again. Another option is to express a few drops of breast milk and gently massage that milk into your nipples.  Continue   breast self-awareness checks. Take care of yourself.  Eat healthy foods. Alternate 3 meals with 3 snacks.  Avoid foods that you notice affect your baby in a bad way.  Drink milk, fruit juice, and water to satisfy your thirst (about 8 glasses a day).   Rest often, relax, and take your prenatal vitamins to prevent fatigue, stress, and anemia.  Avoid chewing and smoking tobacco.  Avoid alcohol and drug use.  Take over-the-counter and prescribed medicine only as directed by your caregiver or pharmacist. You should always check with your caregiver or pharmacist before taking any new medicine, vitamin, or herbal supplement.  Know that pregnancy is possible while breastfeeding. If desired, talk to your caregiver about family planning and safe birth control methods that may be used while breastfeeding. SEEK MEDICAL CARE IF:   You feel like you want to stop breastfeeding or have become frustrated with breastfeeding.  You have painful breasts or nipples.  Your nipples are cracked or  bleeding.  Your breasts are red, tender, or warm.  You have a swollen area on either breast.  You have a fever or chills.  You have nausea or vomiting.  You have drainage from your nipples.  Your breasts do not become full before feedings by the 5th day after delivery.  You feel sad and depressed.  Your baby is too sleepy to eat well.  Your baby is having trouble sleeping.   Your baby is wetting less than 3 diapers in a 24 hour period.  Your baby has less than 3 stools in a 24 hour period.  Your baby's skin or the white part of his or her eyes becomes more yellow.   Your baby is not gaining weight by 5 days of age. MAKE SURE YOU:   Understand these instructions.  Will watch your condition.  Will get help right away if you are not doing well or get worse. Document Released: 04/14/2005 Document Revised: 01/07/2012 Document Reviewed: 11/19/2011 [employer] Patient Information 2014 [employer].  

## 2012-12-03 LAB — HIV ANTIBODY (ROUTINE TESTING W REFLEX): HIV: NONREACTIVE

## 2012-12-04 LAB — CULTURE, OB URINE
Colony Count: NO GROWTH
Organism ID, Bacteria: NO GROWTH

## 2012-12-06 LAB — OBSTETRIC PANEL: Rh Type: POSITIVE

## 2012-12-20 ENCOUNTER — Ambulatory Visit (HOSPITAL_COMMUNITY)
Admission: RE | Admit: 2012-12-20 | Discharge: 2012-12-20 | Disposition: A | Discharge status: Home / Self Care | Payer: Medicaid Other | Source: Ambulatory Visit | Attending: Obstetrics & Gynecology | Admitting: Obstetrics & Gynecology

## 2012-12-20 ENCOUNTER — Other Ambulatory Visit: Payer: Self-pay

## 2012-12-20 ENCOUNTER — Encounter: Payer: Self-pay | Admitting: Obstetrics and Gynecology

## 2012-12-20 DIAGNOSIS — Z3689 Encounter for other specified antenatal screening: Secondary | ICD-10-CM | POA: Insufficient documentation

## 2012-12-20 DIAGNOSIS — Z3682 Encounter for antenatal screening for nuchal translucency: Secondary | ICD-10-CM

## 2012-12-20 DIAGNOSIS — O351XX0 Maternal care for (suspected) chromosomal abnormality in fetus, not applicable or unspecified: Secondary | ICD-10-CM | POA: Insufficient documentation

## 2012-12-20 DIAGNOSIS — O3510X0 Maternal care for (suspected) chromosomal abnormality in fetus, unspecified, not applicable or unspecified: Secondary | ICD-10-CM | POA: Insufficient documentation

## 2012-12-20 IMAGING — US US OB NUCHAL TRANSLUCENCY 1ST GEST
1 series · 14 of 28 positions shown · non-contrast
Comparison: none

[Series 1: us ob nuchal translucency 1st gest · 14 of 42 slices shown]
[im 2/42]
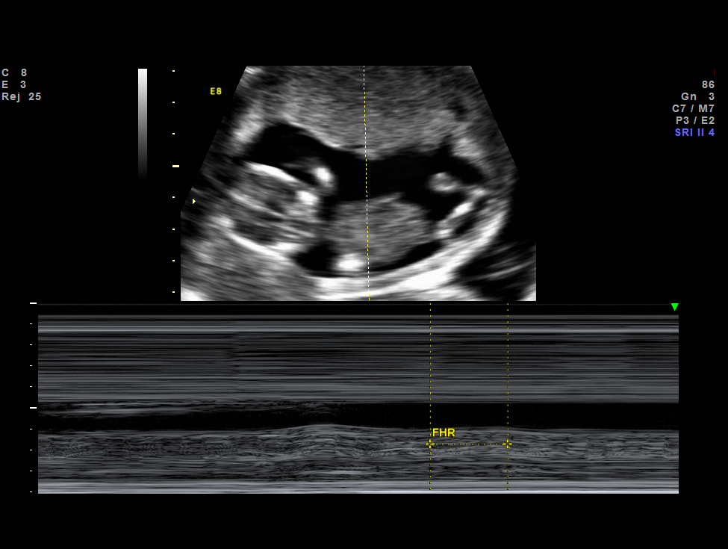
[im 5/42]
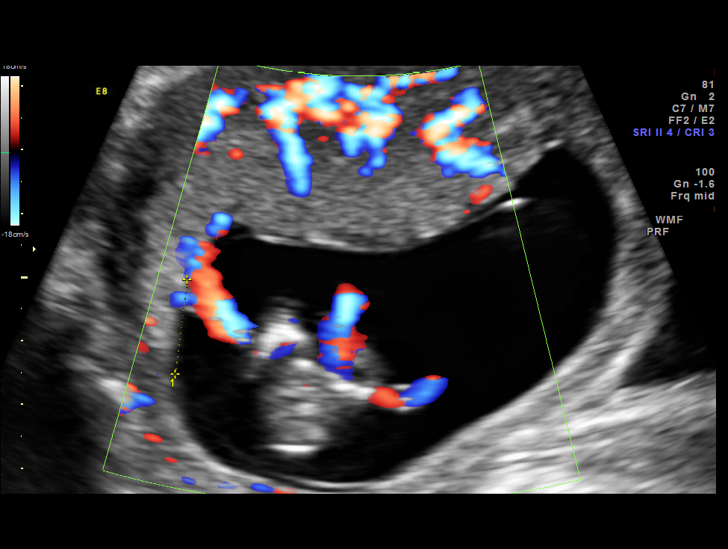
[im 8/42]
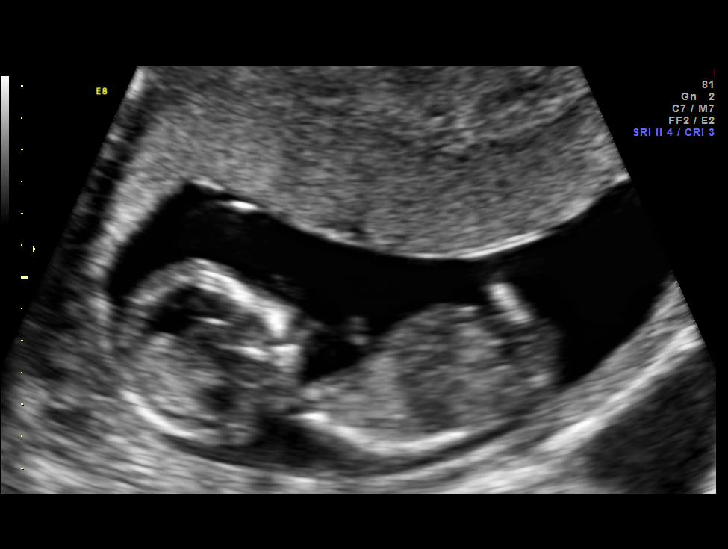
[im 11/42]
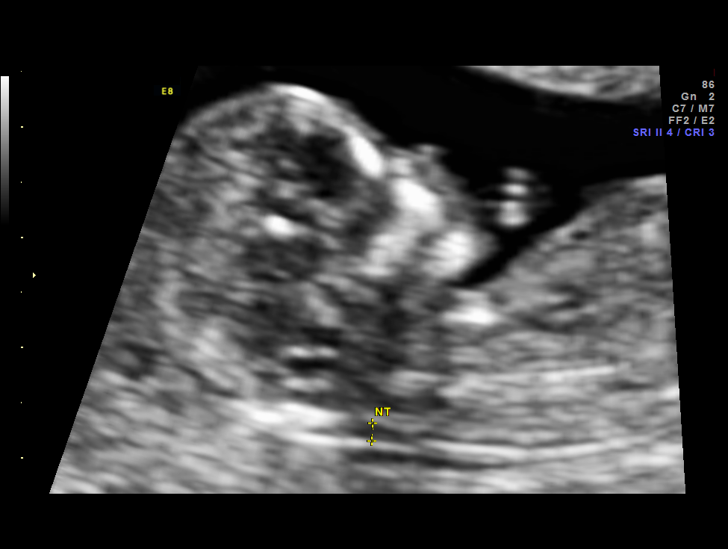
[im 14/42]
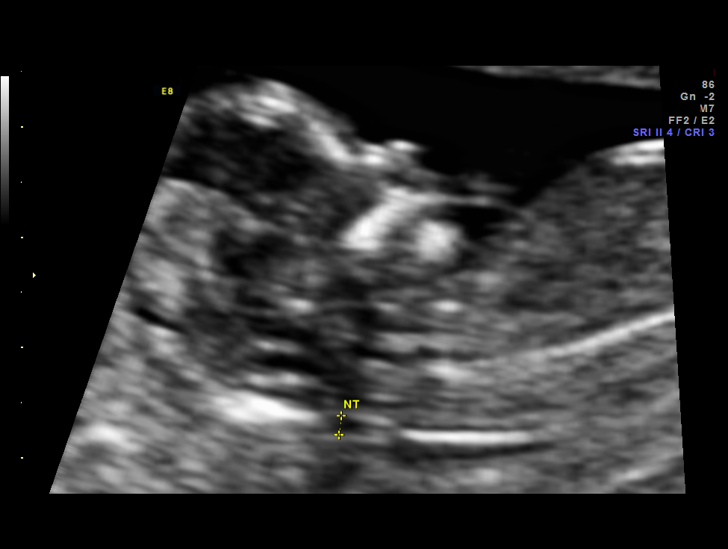
[im 17/42]
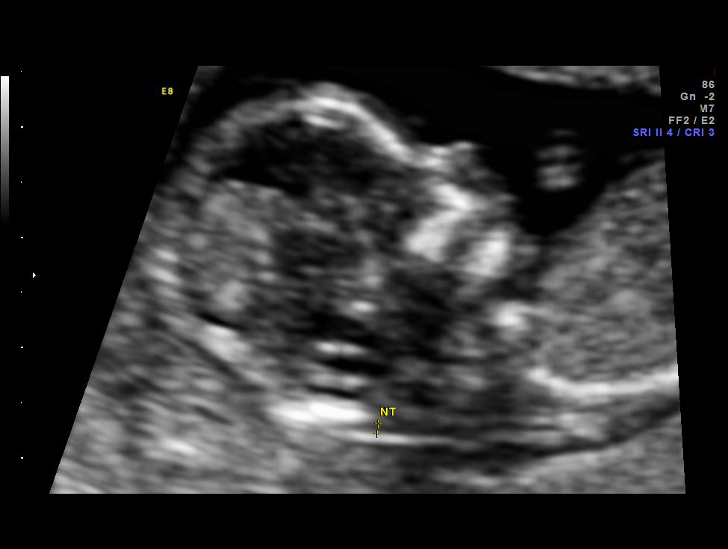
[im 20/42]
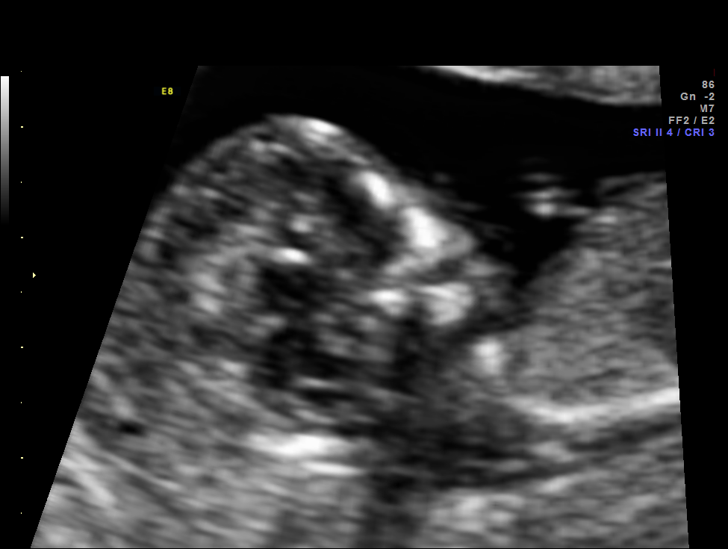
[im 23/42]
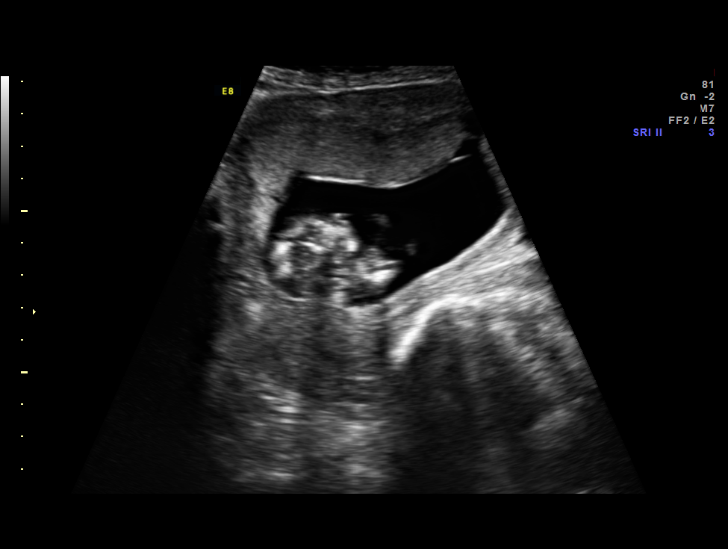
[im 26/42]
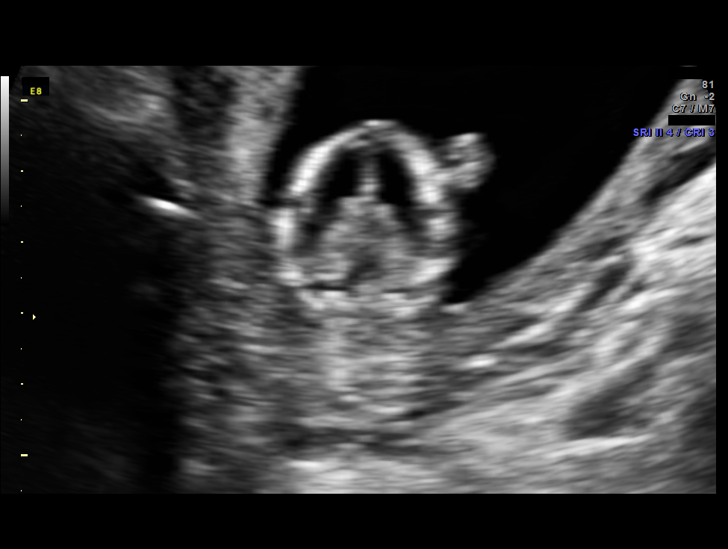
[im 29/42]
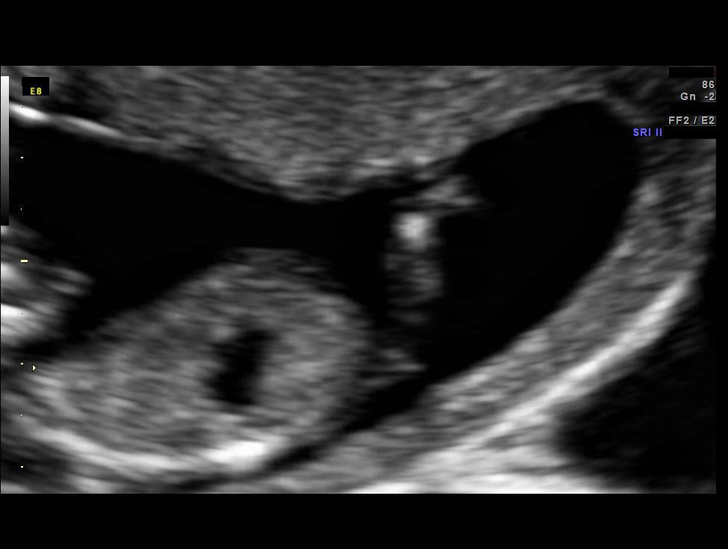
[im 32/42]
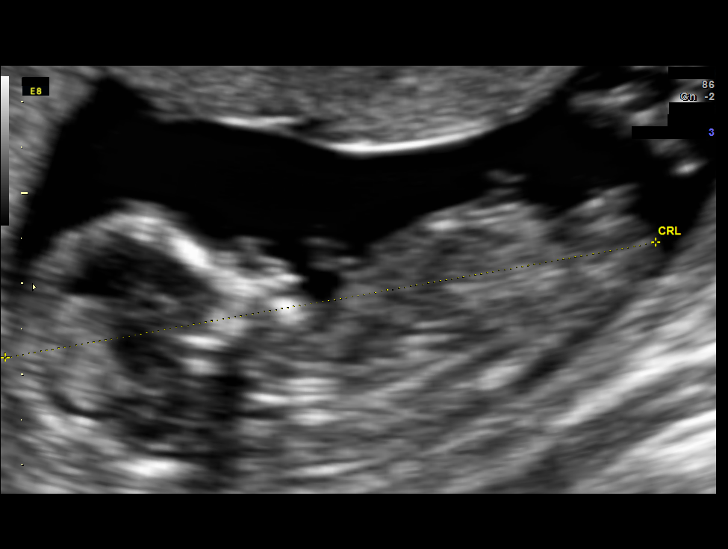
[im 35/42]
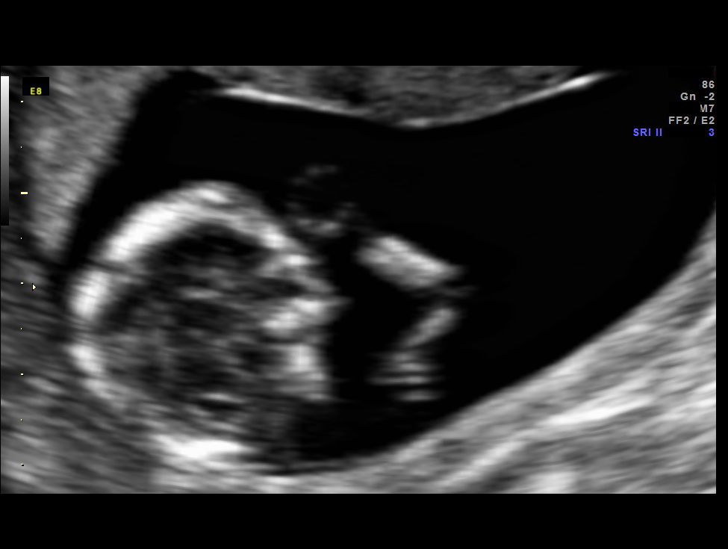
[im 38/42]
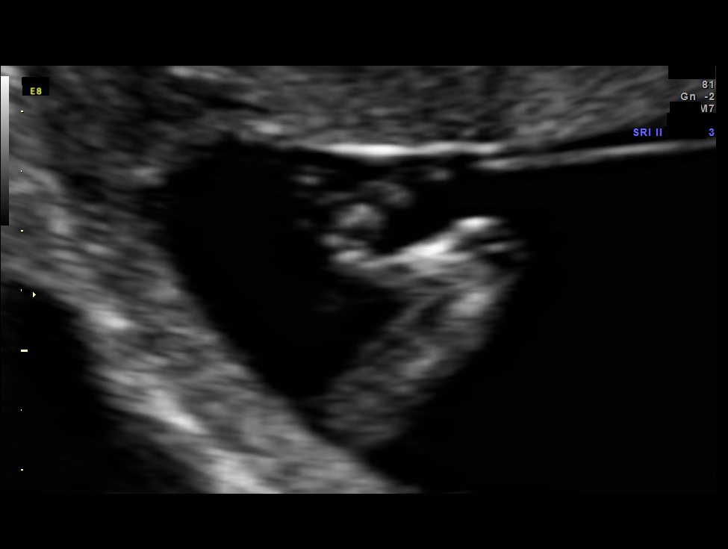
[im 42/42]
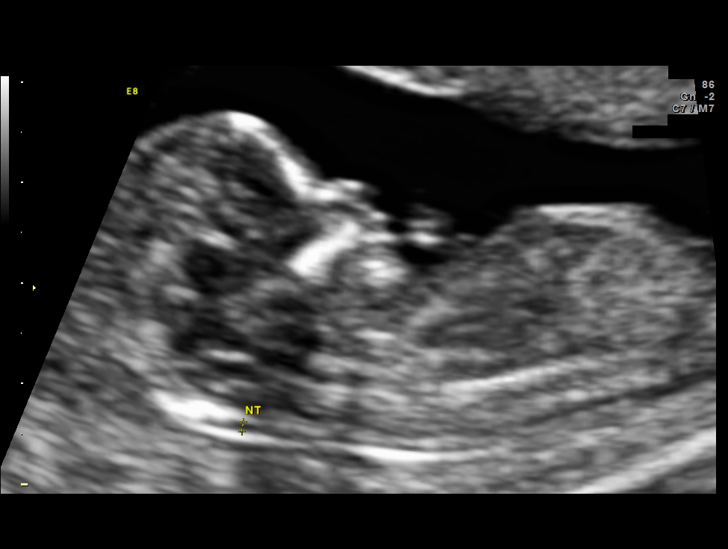

[14 of 28 positions shown; findings below may reference images not displayed]

Canned report from images found in remote index.

Refer to host system for actual result text.

## 2012-12-20 NOTE — Progress Notes (Signed)
OVA GILLENTINE  was seen today for an ultrasound appointment.  See full report in [facility].  Impression: Single IUP at 12 4/7 weeks Normal NT (1.6 mm); Nasal bone visualized First trimester aneuploidy screen performed as noted above.     Recommendations: Please do not draw triple/quad screen, though patient should be offered MSAFP for neural tube defect screening. Recommend ultrasound for fetal anatomy at approx 18 weeks.  Alpha Gula, MD

## 2012-12-20 NOTE — Addendum Note (Signed)
Encounter addended by: Ty Hilts, RN on: 12/20/2012 11:44 AM<BR>     Documentation filed: Charges VN

## 2013-01-07 ENCOUNTER — Ambulatory Visit (INDEPENDENT_AMBULATORY_CARE_PROVIDER_SITE_OTHER): Payer: Medicaid Other | Admitting: Obstetrics & Gynecology

## 2013-01-07 ENCOUNTER — Encounter: Payer: Self-pay | Admitting: Obstetrics & Gynecology

## 2013-01-07 VITALS — BP 112/65 | Wt 148.0 lb

## 2013-01-07 DIAGNOSIS — Z3480 Encounter for supervision of other normal pregnancy, unspecified trimester: Secondary | ICD-10-CM

## 2013-01-07 NOTE — Progress Notes (Signed)
Routine visit. No problems. I will schedule her anatomy u/s for 19 weeks. She will get her flu vaccine today. MSAFP at next visit.

## 2013-01-07 NOTE — Progress Notes (Signed)
P-53

## 2013-01-07 NOTE — Patient Instructions (Signed)
Place ultrasound visit patient instructions here.

## 2013-01-28 ENCOUNTER — Ambulatory Visit (HOSPITAL_COMMUNITY)
Admission: RE | Admit: 2013-01-28 | Discharge: 2013-01-28 | Disposition: A | Discharge status: Home / Self Care | Payer: Medicaid Other | Source: Ambulatory Visit | Attending: Obstetrics & Gynecology | Admitting: Obstetrics & Gynecology

## 2013-01-28 ENCOUNTER — Other Ambulatory Visit: Payer: Self-pay | Admitting: Obstetrics & Gynecology

## 2013-01-28 DIAGNOSIS — Z3480 Encounter for supervision of other normal pregnancy, unspecified trimester: Secondary | ICD-10-CM

## 2013-01-28 DIAGNOSIS — O358XX0 Maternal care for other (suspected) fetal abnormality and damage, not applicable or unspecified: Secondary | ICD-10-CM | POA: Insufficient documentation

## 2013-01-28 DIAGNOSIS — Z1389 Encounter for screening for other disorder: Secondary | ICD-10-CM | POA: Insufficient documentation

## 2013-01-28 DIAGNOSIS — Z363 Encounter for antenatal screening for malformations: Secondary | ICD-10-CM | POA: Insufficient documentation

## 2013-01-28 IMAGING — US US OB DETAIL+14 WK
1 series · 12 of 28 positions shown · non-contrast
Comparison: none

[Series 1: us ob detail +14 wk · 12 of 85 slices shown]
[im 4/85]
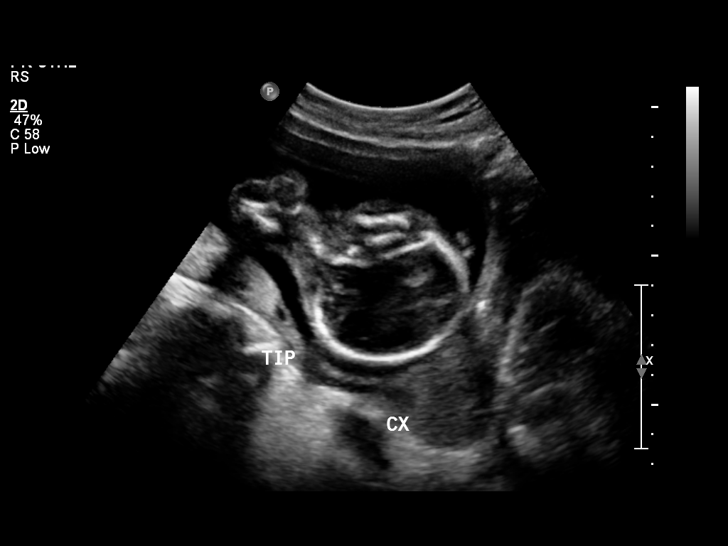
[im 10/85]
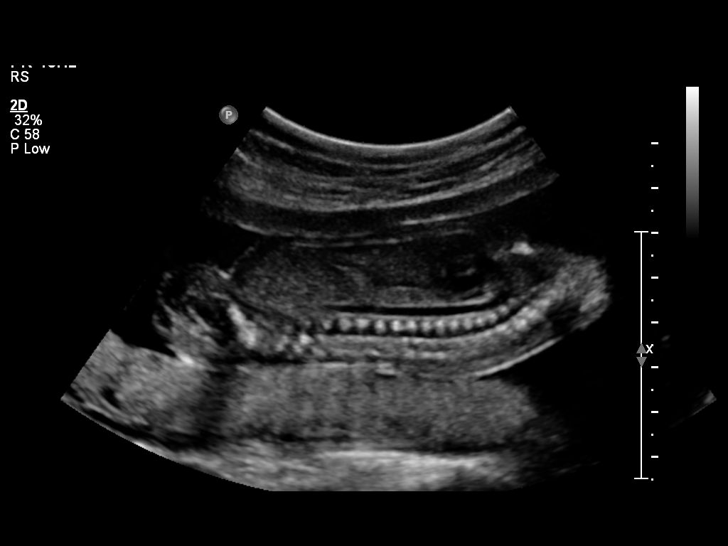
[im 16/85]
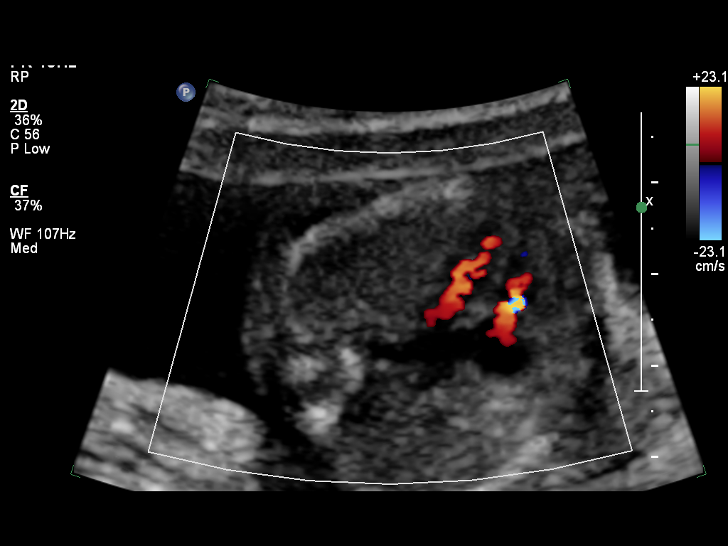
[im 25/85]
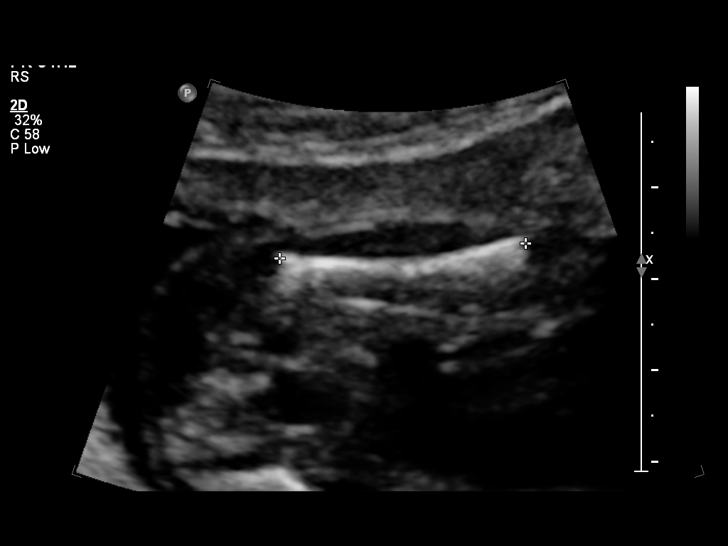
[im 32/85]
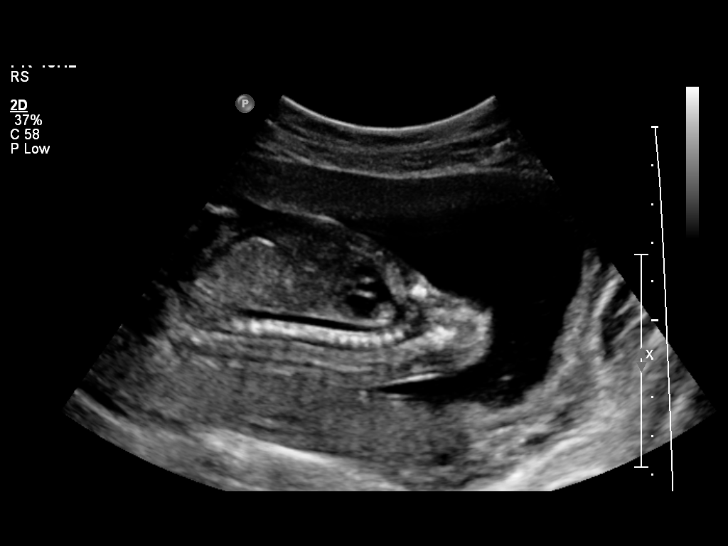
[im 38/85]
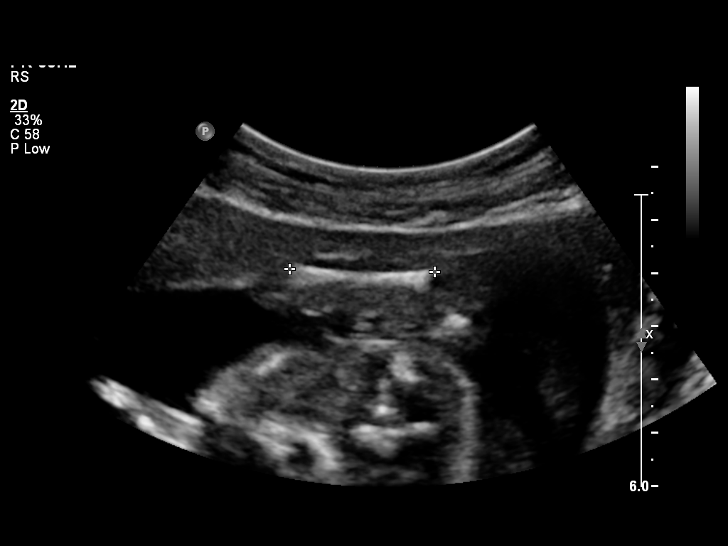
[im 47/85]
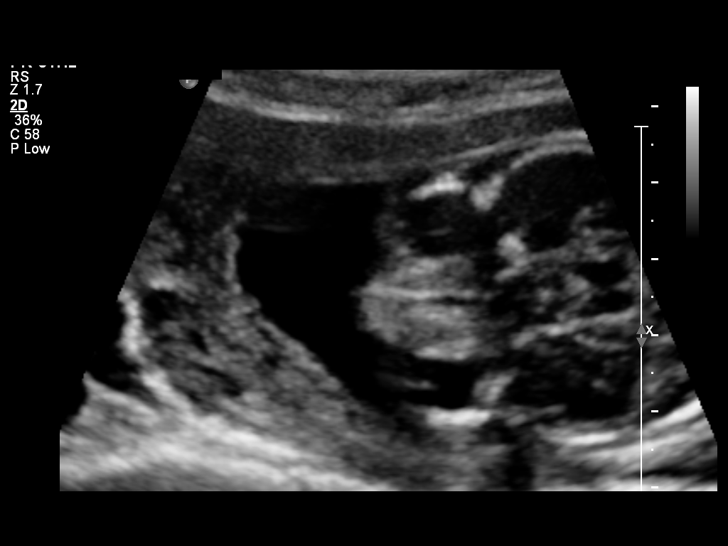
[im 53/85]
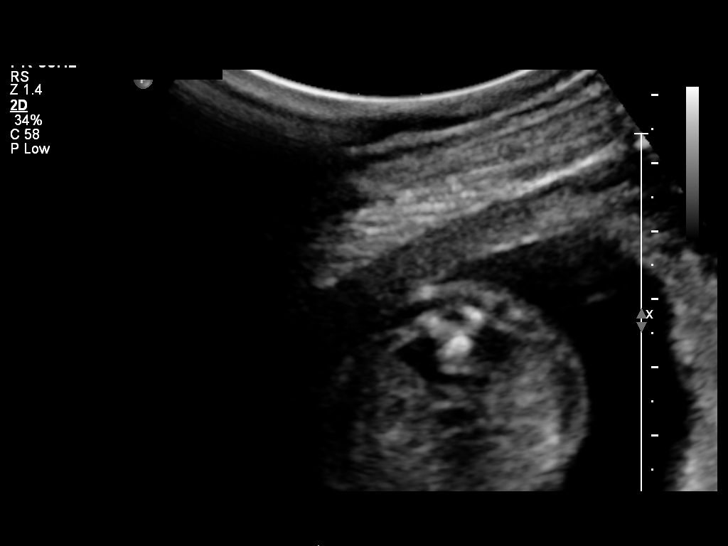
[im 60/85]
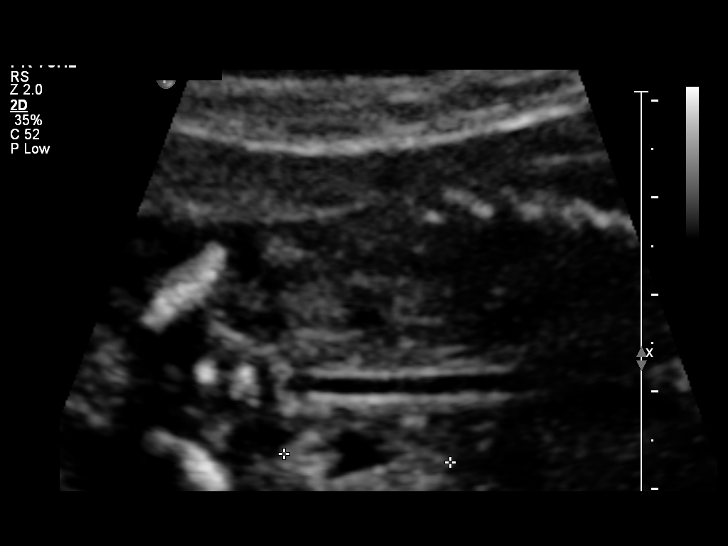
[im 69/85]
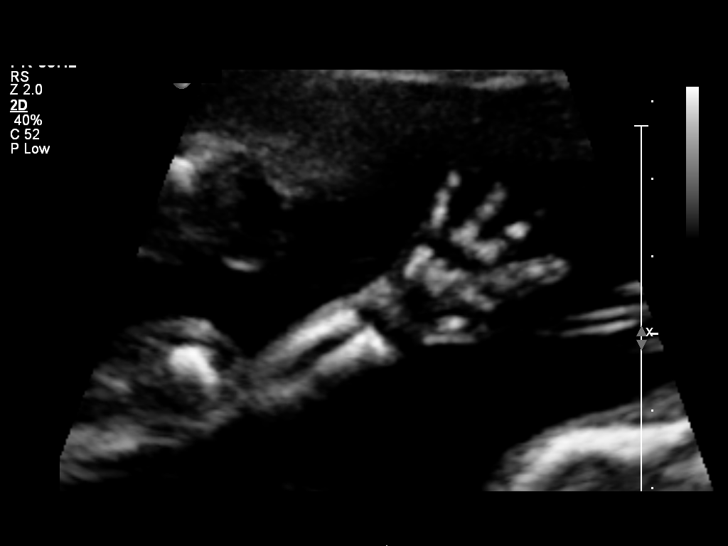
[im 75/85]
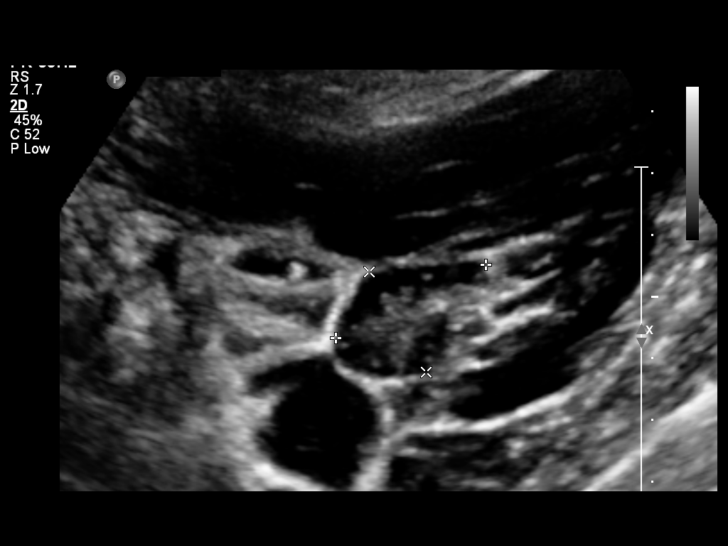
[im 81/85]
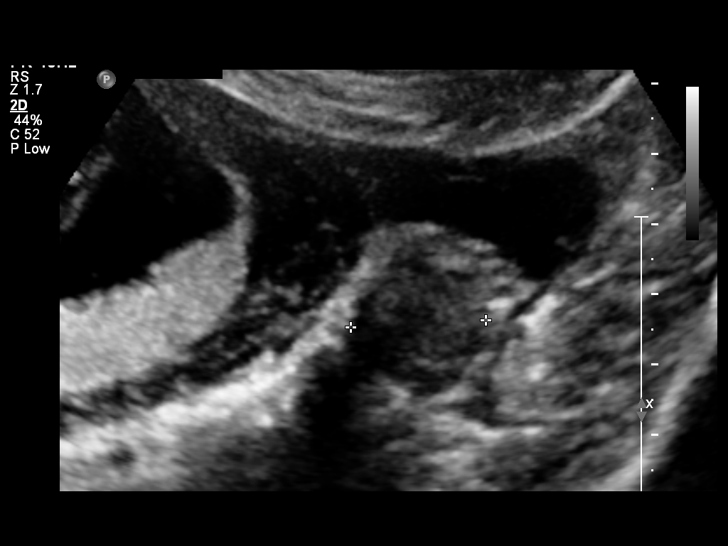

[12 of 28 positions shown; findings below may reference images not displayed]

OBSTETRICS REPORT
                      (Signed Final 01/28/2013 [DATE])

Service(s) Provided

 US OB DETAIL + 14 WK                                  76811.0
Indications

 Detailed fetal anatomic survey
Fetal Evaluation

 Num Of Fetuses:    1
 Fetal Heart Rate:  126                          bpm
 Cardiac Activity:  Observed
 Presentation:      Cephalic
 Placenta:          Posterior, above cervical
                    os
 P. Cord            Visualized, central
 Insertion:

 Amniotic Fluid
 AFI FV:      Subjectively within normal limits
                                             Larg Pckt:    3.20  cm
Biometry

 BPD:     41.4  mm     G. Age:  18w 4d                CI:        71.58   70 - 86
                                                      FL/HC:      17.3   15.8 -
                                                                         18
 HC:     155.8  mm     G. Age:  18w 3d       61  %    HC/AC:      1.15   1.07 -

 AC:     134.9  mm     G. Age:  19w 0d       74  %    FL/BPD:
 FL:      26.9  mm     G. Age:  18w 1d       47  %    FL/AC:      19.9   20 - 24
 HUM:     27.1  mm     G. Age:  18w 4d       69  %
 CER:       18  mm     G. Age:  18w 0d       44  %
 NFT:     2.41  mm

 Est. FW:     247  gm      0 lb 9 oz     56  %
Gestational Age

 LMP:           18w 1d        Date:  09/23/12                 EDD:   06/30/13
 U/S Today:     18w 4d                                        EDD:   06/27/13
 Best:          18w 1d     Det. By:  LMP  (09/23/12)          EDD:   06/30/13
Anatomy
 Cranium:          Appears normal         Aortic Arch:      Appears normal
 Fetal Cavum:      Appears normal         Ductal Arch:      Appears normal
 Ventricles:       Appears normal         Diaphragm:        Appears normal
 Choroid Plexus:   Appears normal         Stomach:          Appears normal, left
                                                            sided
 Cerebellum:       Appears normal         Abdomen:          Appears normal
 Posterior Fossa:  Appears normal         Abdominal Wall:   Appears nml (cord
                                                            insert, abd wall)
 Nuchal Fold:      Appears normal         Cord Vessels:     Appears normal (3
                                                            vessel cord)
 Face:             Appears normal         Kidneys:          Appear normal
                   (orbits and profile)
 Lips:             Appears normal         Bladder:          Appears normal
 Heart:            Echogenic focus        Spine:            Appears normal
                   in LV
 RVOT:             Appears normal         Lower             Appears normal
                                          Extremities:
 LVOT:             Appears normal         Upper             Appears normal
                                          Extremities:

 Other:  Fetus appears to be a female. Heels and 5th digit visualized. Nasal
         bone visualized.
Targeted Anatomy

 Fetal Central Nervous System
 Lat. Ventricles:  7.2                    Cisterna Magna:
Cervix Uterus Adnexa

 Cervical Length:    3.97     cm

 Cervix:       Normal appearance by transabdominal scan.
 Uterus:       No abnormality visualized.
 Cul De Sac:   No free fluid seen.

 Left Ovary:    Size(cm) L: 3.78 x W: 1.91 x H: 2.19  Volume(cc):
                Within normal limits.
 Right Ovary:   Size(cm) L: 2.7 x W: 1.71 x H: 1.86  Volume(cc):
                Within normal limits.

 Adnexa:     No abnormality visualized.
Comments

 The patient's fetal anatomic survey is now complete.  An
 echogenic focus was seen in the left cardiac ventricle.  This is
 felt to represent a calcified papillary muscle, and is not
 associated with structural or functional cardiac abnormalities.
 Although an echogenic cardiac focus may be associated with
 an increased risk of Down syndrome, this risk is felt to be
 minimal, especially when it is seen as an isolated finding.  No
 other fetal anomalies or soft markers of aneuploidy were
 seen.   No further ultrasounds are required unless additional
 problems arise.
Impression

 Single living intrauterine pregnancy at 18 weeks 1 day.
 Appropriate fetal growth (56%).
 Normal amniotic fluid volume.
 Echogenic intracardiac focus.
 Otherwise normal fetal anatomy.
 No other fetal anomalies or soft markers of aneuploidy seen.
Recommendations

 Follow-up ultrasounds as clinically indicated.

                Aujla, Blade

## 2013-02-04 ENCOUNTER — Ambulatory Visit (INDEPENDENT_AMBULATORY_CARE_PROVIDER_SITE_OTHER): Payer: Medicaid Other | Admitting: Obstetrics & Gynecology

## 2013-02-04 VITALS — BP 118/62 | Wt 153.0 lb

## 2013-02-04 DIAGNOSIS — Z3480 Encounter for supervision of other normal pregnancy, unspecified trimester: Secondary | ICD-10-CM

## 2013-02-04 DIAGNOSIS — Z3482 Encounter for supervision of other normal pregnancy, second trimester: Secondary | ICD-10-CM

## 2013-02-04 DIAGNOSIS — Z23 Encounter for immunization: Secondary | ICD-10-CM

## 2013-02-04 NOTE — Progress Notes (Signed)
Normal anatomy scan, just isolated LVEIF.  AFP screen today. No other complaints or concerns.  Routine obstetric precautions reviewed.

## 2013-02-04 NOTE — Patient Instructions (Signed)
Return to clinic for any obstetric concerns or go to [facility] for evaluation  

## 2013-02-04 NOTE — Progress Notes (Signed)
P-53

## 2013-02-07 LAB — ALPHA FETOPROTEIN, MATERNAL
AFP: 43.6 IU/mL
MoM for AFP: 0.94

## 2013-02-09 ENCOUNTER — Encounter: Payer: Self-pay | Admitting: Obstetrics & Gynecology

## 2013-03-03 ENCOUNTER — Ambulatory Visit (INDEPENDENT_AMBULATORY_CARE_PROVIDER_SITE_OTHER): Payer: Medicaid Other | Admitting: Obstetrics & Gynecology

## 2013-03-03 VITALS — BP 121/64 | Wt 156.0 lb

## 2013-03-03 DIAGNOSIS — Z3480 Encounter for supervision of other normal pregnancy, unspecified trimester: Secondary | ICD-10-CM

## 2013-03-03 DIAGNOSIS — Z3482 Encounter for supervision of other normal pregnancy, second trimester: Secondary | ICD-10-CM

## 2013-03-03 NOTE — Progress Notes (Signed)
No other complaints or concerns.  Routine obstetric precautions reviewed.  Third trimester labs, Tdap next visit.

## 2013-03-03 NOTE — Progress Notes (Signed)
P-78 

## 2013-03-03 NOTE — Patient Instructions (Addendum)
Return to clinic for any obstetric concerns or go to [facility] for evaluation Tetanus, Diphtheria, Pertussis (Tdap) Vaccine What You Need to Know WHY GET VACCINATED? Tetanus, diphtheria and pertussis can be very serious diseases, even for adolescents and adults. Tdap vaccine can protect us from these diseases. TETANUS (Lockjaw) causes painful muscle tightening and stiffness, usually all over the body.  It can lead to tightening of muscles in the head and neck so you can't open your mouth, swallow, or sometimes even breathe. Tetanus kills about 1 out of 5 people who are infected. DIPHTHERIA can cause a thick coating to form in the back of the throat.  It can lead to breathing problems, paralysis, heart failure, and death. PERTUSSIS (Whooping Cough) causes severe coughing spells, which can cause difficulty breathing, vomiting and disturbed sleep.  It can also lead to weight loss, incontinence, and rib fractures. Up to 2 in 100 adolescents and 5 in 100 adults with pertussis are hospitalized or have complications, which could include pneumonia and death. These diseases are caused by bacteria. Diphtheria and pertussis are spread from person to person through coughing or sneezing. Tetanus enters the body through cuts, scratches, or wounds. Before vaccines, the [place] saw as many as 200,000 cases a year of diphtheria and pertussis, and hundreds of cases of tetanus. Since vaccination began, tetanus and diphtheria have dropped by about 99% and pertussis by about 80%. TDAP VACCINE Tdap vaccine can protect adolescents and adults from tetanus, diphtheria, and pertussis. One dose of Tdap is routinely given at age 11 or 12. People who did not get Tdap at that age should get it as soon as possible. Tdap is especially important for health care professionals and anyone having close contact with a baby younger than 12 months. Pregnant women should get a dose of Tdap during every pregnancy, to protect the newborn  from pertussis. Infants are most at risk for severe, life-threatening complications from pertussis. A similar vaccine, called Td, protects from tetanus and diphtheria, but not pertussis. A Td booster should be given every 10 years. Tdap may be given as one of these boosters if you have not already gotten a dose. Tdap may also be given after a severe cut or burn to prevent tetanus infection. Your doctor can give you more information. Tdap may safely be given at the same time as other vaccines. SOME PEOPLE SHOULD NOT GET THIS VACCINE  If you ever had a life-threatening allergic reaction after a dose of any tetanus, diphtheria, or pertussis containing vaccine, OR if you have a severe allergy to any part of this vaccine, you should not get Tdap. Tell your doctor if you have any severe allergies.  If you had a coma, or long or multiple seizures within 7 days after a childhood dose of DTP or DTaP, you should not get Tdap, unless a cause other than the vaccine was found. You can still get Td.  Talk to your doctor if you:  have epilepsy or another nervous system problem,  had severe pain or swelling after any vaccine containing diphtheria, tetanus or pertussis,  ever had Guillain-Barr Syndrome (GBS),  aren't feeling well on the day the shot is scheduled. RISKS OF A VACCINE REACTION With any medicine, including vaccines, there is a chance of side effects. These are usually mild and go away on their own, but serious reactions are also possible. Brief fainting spells can follow a vaccination, leading to injuries from falling. Sitting or lying down for about 15 minutes can   help prevent these. Tell your doctor if you feel dizzy or light-headed, or have vision changes or ringing in the ears. Mild problems following Tdap (Did not interfere with activities)  Pain where the shot was given (about 3 in 4 adolescents or 2 in 3 adults)  Redness or swelling where the shot was given (about 1 person in 5)  Mild  fever of at least 100.4F (up to about 1 in 25 adolescents or 1 in 100 adults)  Headache (about 3 or 4 people in 10)  Tiredness (about 1 person in 3 or 4)  Nausea, vomiting, diarrhea, stomach ache (up to 1 in 4 adolescents or 1 in 10 adults)  Chills, body aches, sore joints, rash, swollen glands (uncommon) Moderate problems following Tdap (Interfered with activities, but did not require medical attention)  Pain where the shot was given (about 1 in 5 adolescents or 1 in 100 adults)  Redness or swelling where the shot was given (up to about 1 in 16 adolescents or 1 in 25 adults)  Fever over 102F (about 1 in 100 adolescents or 1 in 250 adults)  Headache (about 3 in 20 adolescents or 1 in 10 adults)  Nausea, vomiting, diarrhea, stomach ache (up to 1 or 3 people in 100)  Swelling of the entire arm where the shot was given (up to about 3 in 100). Severe problems following Tdap (Unable to perform usual activities, required medical attention)  Swelling, severe pain, bleeding and redness in the arm where the shot was given (rare). A severe allergic reaction could occur after any vaccine (estimated less than 1 in a million doses). WHAT IF THERE IS A SERIOUS REACTION? What should I look for?  Look for anything that concerns you, such as signs of a severe allergic reaction, very high fever, or behavior changes. Signs of a severe allergic reaction can include hives, swelling of the face and throat, difficulty breathing, a fast heartbeat, dizziness, and weakness. These would start a few minutes to a few hours after the vaccination. What should I do?  If you think it is a severe allergic reaction or other emergency that can't wait, call 9-1-1 or get the person to the nearest hospital. Otherwise, call your doctor.  Afterward, the reaction should be reported to the "Vaccine Adverse Event Reporting System" (VAERS). Your doctor might file this report, or you can do it yourself through the VAERS web  site at www.vaers.hhs.gov, or by calling 1-800-822-7967. VAERS is only for reporting reactions. They do not give medical advice.  THE NATIONAL VACCINE INJURY COMPENSATION PROGRAM The National Vaccine Injury Compensation Program (VICP) is a federal program that was created to compensate people who may have been injured by certain vaccines. Persons who believe they may have been injured by a vaccine can learn about the program and about filing a claim by calling 1-800-338-2382 or visiting the VICP website at www.hrsa.gov/vaccinecompensation. HOW CAN I LEARN MORE?  Ask your doctor.  Call your local or state health department.  Contact the Centers for Disease Control and Prevention (CDC):  Call 1-800-232-4636 or visit CDC's website at www.cdc.gov/vaccines. CDC Tdap Vaccine VIS (09/04/11) Document Released: 10/14/2011 Document Revised: 08/09/2012 Document Reviewed: 08/04/2012 [employer] Patient Information 2014 [employer].  

## 2013-03-18 ENCOUNTER — Ambulatory Visit (INDEPENDENT_AMBULATORY_CARE_PROVIDER_SITE_OTHER): Payer: Medicaid Other | Admitting: Obstetrics and Gynecology

## 2013-03-18 ENCOUNTER — Encounter: Payer: Self-pay | Admitting: Obstetrics and Gynecology

## 2013-03-18 VITALS — BP 125/68 | Wt 159.0 lb

## 2013-03-18 DIAGNOSIS — Z3480 Encounter for supervision of other normal pregnancy, unspecified trimester: Secondary | ICD-10-CM

## 2013-03-18 DIAGNOSIS — Z3482 Encounter for supervision of other normal pregnancy, second trimester: Secondary | ICD-10-CM

## 2013-03-18 NOTE — Progress Notes (Signed)
Patient doing well without complaints. Patient expressed some concerns regarding fetal heart rate as her son was recently diagnosed with a heart murmur. Anatomy ultrasound reviewed with patient. Offered to schedule fetal echo. Patient desires to wait until she meets with her son's cardiologist before pursuing further fetal evaluation. 1hr GCT, Tdap and labs at next visit. FM/PTl precautions reviewed.

## 2013-03-18 NOTE — Progress Notes (Signed)
P - 67 

## 2013-04-07 ENCOUNTER — Encounter: Payer: Self-pay | Admitting: Obstetrics & Gynecology

## 2013-04-07 ENCOUNTER — Ambulatory Visit (INDEPENDENT_AMBULATORY_CARE_PROVIDER_SITE_OTHER): Payer: Medicaid Other | Admitting: Obstetrics & Gynecology

## 2013-04-07 VITALS — BP 107/60 | Wt 163.0 lb

## 2013-04-07 DIAGNOSIS — Z3480 Encounter for supervision of other normal pregnancy, unspecified trimester: Secondary | ICD-10-CM

## 2013-04-07 DIAGNOSIS — Z23 Encounter for immunization: Secondary | ICD-10-CM

## 2013-04-07 DIAGNOSIS — Z3483 Encounter for supervision of other normal pregnancy, third trimester: Secondary | ICD-10-CM

## 2013-04-07 LAB — CBC
HCT: 30.7 % — ABNORMAL LOW (ref 36.0–46.0)
Hemoglobin: 10.5 g/dL — ABNORMAL LOW (ref 12.0–15.0)
MCH: 29.1 pg (ref 26.0–34.0)
MCHC: 34.2 g/dL (ref 30.0–36.0)
MCV: 85 fL (ref 78.0–100.0)
Platelets: 257 10*3/uL (ref 150–400)
RBC: 3.61 MIL/uL — ABNORMAL LOW (ref 3.87–5.11)
RDW: 13.2 % (ref 11.5–15.5)
WBC: 11.2 10*3/uL — ABNORMAL HIGH (ref 4.0–10.5)

## 2013-04-07 MED ORDER — TETANUS-DIPHTH-ACELL PERTUSSIS 5-2.5-18.5 LF-MCG/0.5 IM SUSP: 0.5000 mL | Freq: Once | INTRAMUSCULAR | Status: DC

## 2013-04-07 NOTE — Patient Instructions (Addendum)
Return to clinic for any obstetric concerns or go to [facility] for evaluation  Thank you for enrolling in MyChart. Please follow the instructions below to securely access your online medical record. MyChart allows you to send messages to your doctor, view your test results, manage appointments, and more.   How Do I Sign Up? 1. In your Internet browser, go to [employer] and enter [email or web address]. 2. Click on the Sign Up Now link in the Sign In box. You will see the New Member Sign Up page. 3. Enter your MyChart Access Code exactly as it appears below. You will not need to use this code after you've completed the sign-up process. If you do not sign up before the expiration date, you must request a new code.  MyChart Access Code: [email or web address] Expires: 06/06/2013 11:10 AM  4. Enter your Social Security Number ([email or web address]) and Date of Birth (mm/dd/yyyy) as indicated and click Submit. You will be taken to the next sign-up page. 5. Create a MyChart ID. This will be your MyChart login ID and cannot be changed, so think of one that is secure and easy to remember. 6. Create a MyChart password. You can change your password at any time. 7. Enter your Password Reset Question and Answer. This can be used at a later time if you forget your password.  8. Enter your e-mail address. You will receive e-mail notification when new information is available in MyChart. 9. Click Sign Up. You can now view your medical record.   Additional Information Remember, MyChart is NOT to be used for urgent needs. For medical emergencies, dial 911.

## 2013-04-07 NOTE — Progress Notes (Signed)
1 hr GTT, labs, Tdap today. No other complaints or concerns.  Fetal movement and labor precautions reviewed. 

## 2013-04-07 NOTE — Progress Notes (Signed)
P-71 

## 2013-04-08 LAB — HIV ANTIBODY (ROUTINE TESTING W REFLEX): HIV: NONREACTIVE

## 2013-04-08 LAB — RPR

## 2013-04-09 ENCOUNTER — Encounter: Payer: Self-pay | Admitting: Obstetrics & Gynecology

## 2013-04-26 ENCOUNTER — Encounter: Payer: Self-pay | Admitting: Family Medicine

## 2013-04-26 ENCOUNTER — Ambulatory Visit (INDEPENDENT_AMBULATORY_CARE_PROVIDER_SITE_OTHER): Payer: Medicaid Other | Admitting: Family Medicine

## 2013-04-26 VITALS — BP 98/60 | Wt 167.0 lb

## 2013-04-26 DIAGNOSIS — Z3483 Encounter for supervision of other normal pregnancy, third trimester: Secondary | ICD-10-CM

## 2013-04-26 DIAGNOSIS — Z3480 Encounter for supervision of other normal pregnancy, unspecified trimester: Secondary | ICD-10-CM

## 2013-04-26 NOTE — Patient Instructions (Signed)
Third Trimester of Pregnancy The third trimester is from week 29 through week 42, months 7 through 9. The third trimester is a time when the fetus is growing rapidly. At the end of the ninth month, the fetus is about 20 inches in length and weighs 6 10 pounds.  BODY CHANGES Your body goes through many changes during pregnancy. The changes vary from woman to woman.   Your weight will continue to increase. You can expect to gain 25 35 pounds (11 16 kg) by the end of the pregnancy.  You may begin to get stretch marks on your hips, abdomen, and breasts.  You may urinate more often because the fetus is moving lower into your pelvis and pressing on your bladder.  You may develop or continue to have heartburn as a result of your pregnancy.  You may develop constipation because certain hormones are causing the muscles that push waste through your intestines to slow down.  You may develop hemorrhoids or swollen, bulging veins (varicose veins).  You may have pelvic pain because of the weight gain and pregnancy hormones relaxing your joints between the bones in your pelvis. Back aches may result from over exertion of the muscles supporting your posture.  Your breasts will continue to grow and be tender. A yellow discharge may leak from your breasts called colostrum.  Your belly button may stick out.  You may feel short of breath because of your expanding uterus.  You may notice the fetus "dropping," or moving lower in your abdomen.  You may have a bloody mucus discharge. This usually occurs a few days to a week before labor begins.  Your cervix becomes thin and soft (effaced) near your due date. WHAT TO EXPECT AT YOUR PRENATAL EXAMS  You will have prenatal exams every 2 weeks until week 36. Then, you will have weekly prenatal exams. During a routine prenatal visit:  You will be weighed to make sure you and the fetus are growing normally.  Your blood pressure is taken.  Your abdomen will  be measured to track your baby's growth.  The fetal heartbeat will be listened to.  Any test results from the previous visit will be discussed.  You may have a cervical check near your due date to see if you have effaced. At around 36 weeks, your caregiver will check your cervix. At the same time, your caregiver will also perform a test on the secretions of the vaginal tissue. This test is to determine if a type of bacteria, Group B streptococcus, is present. Your caregiver will explain this further. Your caregiver may ask you:  What your birth plan is.  How you are feeling.  If you are feeling the baby move.  If you have had any abnormal symptoms, such as leaking fluid, bleeding, severe headaches, or abdominal cramping.  If you have any questions. Other tests or screenings that may be performed during your third trimester include:  Blood tests that check for low iron levels (anemia).  Fetal testing to check the health, activity level, and growth of the fetus. Testing is done if you have certain medical conditions or if there are problems during the pregnancy. FALSE LABOR You may feel small, irregular contractions that eventually go away. These are called Braxton Hicks contractions, or false labor. Contractions may last for hours, days, or even weeks before true labor sets in. If contractions come at regular intervals, intensify, or become painful, it is best to be seen by your caregiver.    SIGNS OF LABOR   Menstrual-like cramps.  Contractions that are 5 minutes apart or less.  Contractions that start on the top of the uterus and spread down to the lower abdomen and back.  A sense of increased pelvic pressure or back pain.  A watery or bloody mucus discharge that comes from the vagina. If you have any of these signs before the 37th week of pregnancy, call your caregiver right away. You need to go to the hospital to get checked immediately. HOME CARE INSTRUCTIONS   Avoid all  smoking, herbs, alcohol, and unprescribed drugs. These chemicals affect the formation and growth of the baby.  Follow your caregiver's instructions regarding medicine use. There are medicines that are either safe or unsafe to take during pregnancy.  Exercise only as directed by your caregiver. Experiencing uterine cramps is a good sign to stop exercising.  Continue to eat regular, healthy meals.  Wear a good support bra for breast tenderness.  Do not use hot tubs, steam rooms, or saunas.  Wear your seat belt at all times when driving.  Avoid raw meat, uncooked cheese, cat litter boxes, and soil used by cats. These carry germs that can cause birth defects in the baby.  Take your prenatal vitamins.  Try taking a stool softener (if your caregiver approves) if you develop constipation. Eat more high-fiber foods, such as fresh vegetables or fruit and whole grains. Drink plenty of fluids to keep your urine clear or pale yellow.  Take warm sitz baths to soothe any pain or discomfort caused by hemorrhoids. Use hemorrhoid cream if your caregiver approves.  If you develop varicose veins, wear support hose. Elevate your feet for 15 minutes, 3 4 times a day. Limit salt in your diet.  Avoid heavy lifting, wear low heal shoes, and practice good posture.  Rest a lot with your legs elevated if you have leg cramps or low back pain.  Visit your dentist if you have not gone during your pregnancy. Use a soft toothbrush to brush your teeth and be gentle when you floss.  A sexual relationship may be continued unless your caregiver directs you otherwise.  Do not travel far distances unless it is absolutely necessary and only with the approval of your caregiver.  Take prenatal classes to understand, practice, and ask questions about the labor and delivery.  Make a trial run to the hospital.  Pack your hospital bag.  Prepare the baby's nursery.  Continue to go to all your prenatal visits as directed  by your caregiver. SEEK MEDICAL CARE IF:  You are unsure if you are in labor or if your water has broken.  You have dizziness.  You have mild pelvic cramps, pelvic pressure, or nagging pain in your abdominal area.  You have persistent nausea, vomiting, or diarrhea.  You have a bad smelling vaginal discharge.  You have pain with urination. SEEK IMMEDIATE MEDICAL CARE IF:   You have a fever.  You are leaking fluid from your vagina.  You have spotting or bleeding from your vagina.  You have severe abdominal cramping or pain.  You have rapid weight loss or gain.  You have shortness of breath with chest pain.  You notice sudden or extreme swelling of your face, hands, ankles, feet, or legs.  You have not felt your baby move in over an hour.  You have severe headaches that do not go away with medicine.  You have vision changes. Document Released: 04/08/2001 Document Revised: 12/15/2012 Document Reviewed:   06/15/2012 [employer] Patient Information 2014 [employer].  Breastfeeding Deciding to breastfeed is one of the best choices you can make for you and your baby. A change in hormones during pregnancy causes your breast tissue to grow and increases the number and size of your milk ducts. These hormones also allow proteins, sugars, and fats from your blood supply to make breast milk in your milk-producing glands. Hormones prevent breast milk from being released before your baby is born as well as prompt milk flow after birth. Once breastfeeding has begun, thoughts of your baby, as well as his or her sucking or crying, can stimulate the release of milk from your milk-producing glands.  BENEFITS OF BREASTFEEDING For Your Baby  Your first milk (colostrum) helps your baby's digestive system function better.   There are antibodies in your milk that help your baby fight off infections.   Your baby has a lower incidence of asthma, allergies, and sudden infant death syndrome.    The nutrients in breast milk are better for your baby than infant formulas and are designed uniquely for your baby's needs.   Breast milk improves your baby's brain development.   Your baby is less likely to develop other conditions, such as childhood obesity, asthma, or type 2 diabetes mellitus.  For You   Breastfeeding helps to create a very special bond between you and your baby.   Breastfeeding is convenient. Breast milk is always available at the correct temperature and costs nothing.   Breastfeeding helps to burn calories and helps you lose the weight gained during pregnancy.   Breastfeeding makes your uterus contract to its prepregnancy size faster and slows bleeding (lochia) after you give birth.   Breastfeeding helps to lower your risk of developing type 2 diabetes mellitus, osteoporosis, and breast or ovarian cancer later in life. SIGNS THAT YOUR BABY IS HUNGRY Early Signs of Hunger  Increased alertness or activity.  Stretching.  Movement of the head from side to side.  Movement of the head and opening of the mouth when the corner of the mouth or cheek is stroked (rooting).  Increased sucking sounds, smacking lips, cooing, sighing, or squeaking.  Hand-to-mouth movements.  Increased sucking of fingers or hands. Late Signs of Hunger  Fussing.  Intermittent crying. Extreme Signs of Hunger Signs of extreme hunger will require calming and consoling before your baby will be able to breastfeed successfully. Do not wait for the following signs of extreme hunger to occur before you initiate breastfeeding:   Restlessness.  A loud, strong cry.   Screaming. BREASTFEEDING BASICS Breastfeeding Initiation  Find a comfortable place to sit or lie down, with your neck and back well supported.  Place a pillow or rolled up blanket under your baby to bring him or her to the level of your breast (if you are seated). Nursing pillows are specially designed to help  support your arms and your baby while you breastfeed.  Make sure that your baby's abdomen is facing your abdomen.   Gently massage your breast. With your fingertips, massage from your chest wall toward your nipple in a circular motion. This encourages milk flow. You may need to continue this action during the feeding if your milk flows slowly.  Support your breast with 4 fingers underneath and your thumb above your nipple. Make sure your fingers are well away from your nipple and your baby's mouth.   Stroke your baby's lips gently with your finger or nipple.   When your baby's mouth is   open wide enough, quickly bring your baby to your breast, placing your entire nipple and as much of the colored area around your nipple (areola) as possible into your baby's mouth.   More areola should be visible above your baby's upper lip than below the lower lip.   Your baby's tongue should be between his or her lower gum and your breast.   Ensure that your baby's mouth is correctly positioned around your nipple (latched). Your baby's lips should create a seal on your breast and be turned out (everted).  It is common for your baby to suck about 2 3 minutes in order to start the flow of breast milk. Latching Teaching your baby how to latch on to your breast properly is very important. An improper latch can cause nipple pain and decreased milk supply for you and poor weight gain in your baby. Also, if your baby is not latched onto your nipple properly, he or she may swallow some air during feeding. This can make your baby fussy. Burping your baby when you switch breasts during the feeding can help to get rid of the air. However, teaching your baby to latch on properly is still the best way to prevent fussiness from swallowing air while breastfeeding. Signs that your baby has successfully latched on to your nipple:    Silent tugging or silent sucking, without causing you pain.   Swallowing heard  between every 3 4 sucks.    Muscle movement above and in front of his or her ears while sucking.  Signs that your baby has not successfully latched on to nipple:   Sucking sounds or smacking sounds from your baby while breastfeeding.  Nipple pain. If you think your baby has not latched on correctly, slip your finger into the corner of your baby's mouth to break the suction and place it between your baby's gums. Attempt breastfeeding initiation again. Signs of Successful Breastfeeding Signs from your baby:   A gradual decrease in the number of sucks or complete cessation of sucking.   Falling asleep.   Relaxation of his or her body.   Retention of a small amount of milk in his or her mouth.   Letting go of your breast by himself or herself. Signs from you:  Breasts that have increased in firmness, weight, and size 1 3 hours after feeding.   Breasts that are softer immediately after breastfeeding.  Increased milk volume, as well as a change in milk consistency and color by the 5th day of breastfeeding.   Nipples that are not sore, cracked, or bleeding. Signs That Your Baby is Getting Enough Milk  Wetting at least 3 diapers in a 24-hour period. The urine should be clear and pale yellow by age 5 days.  At least 3 stools in a 24-hour period by age 5 days. The stool should be soft and yellow.  At least 3 stools in a 24-hour period by age 7 days. The stool should be seedy and yellow.  No loss of weight greater than 10% of birth weight during the first 3 days of age.  Average weight gain of 4 7 ounces (120 210 mL) per week after age 4 days.  Consistent daily weight gain by age 5 days, without weight loss after the age of 2 weeks. After a feeding, your baby may spit up a small amount. This is common. BREASTFEEDING FREQUENCY AND DURATION Frequent feeding will help you make more milk and can prevent sore nipples and breast engorgement.   Breastfeed when you feel the need to  reduce the fullness of your breasts or when your baby shows signs of hunger. This is called "breastfeeding on demand." Avoid introducing a pacifier to your baby while you are working to establish breastfeeding (the first 4 6 weeks after your baby is born). After this time you may choose to use a pacifier. Research has shown that pacifier use during the first year of a baby's life decreases the risk of sudden infant death syndrome (SIDS). Allow your baby to feed on each breast as long as he or she wants. Breastfeed until your baby is finished feeding. When your baby unlatches or falls asleep while feeding from the first breast, offer the second breast. Because newborns are often sleepy in the first few weeks of life, you may need to awaken your baby to get him or her to feed. Breastfeeding times will vary from baby to baby. However, the following rules can serve as a guide to help you ensure that your baby is properly fed:  Newborns (babies 4 weeks of age or younger) may breastfeed every 1 3 hours.  Newborns should not go longer than 3 hours during the day or 5 hours during the night without breastfeeding.  You should breastfeed your baby a minimum of 8 times in a 24-hour period until you begin to introduce solid foods to your baby at around 6 months of age. BREAST MILK PUMPING Pumping and storing breast milk allows you to ensure that your baby is exclusively fed your breast milk, even at times when you are unable to breastfeed. This is especially important if you are going back to work while you are still breastfeeding or when you are not able to be present during feedings. Your lactation consultant can give you guidelines on how long it is safe to store breast milk.  A breast pump is a machine that allows you to pump milk from your breast into a sterile bottle. The pumped breast milk can then be stored in a refrigerator or freezer. Some breast pumps are operated by hand, while others use electricity. Ask  your lactation consultant which type will work best for you. Breast pumps can be purchased, but some hospitals and breastfeeding support groups lease breast pumps on a monthly basis. A lactation consultant can teach you how to hand express breast milk, if you prefer not to use a pump.  CARING FOR YOUR BREASTS WHILE YOU BREASTFEED Nipples can become dry, cracked, and sore while breastfeeding. The following recommendations can help keep your breasts moisturized and healthy:  Avoid using soap on your nipples.   Wear a supportive bra. Although not required, special nursing bras and tank tops are designed to allow access to your breasts for breastfeeding without taking off your entire bra or top. Avoid wearing underwire style bras or extremely tight bras.  Air dry your nipples for 3 4minutes after each feeding.   Use only cotton bra pads to absorb leaked breast milk. Leaking of breast milk between feedings is normal.   Use lanolin on your nipples after breastfeeding. Lanolin helps to maintain your skin's normal moisture barrier. If you use pure lanolin you do not need to wash it off before feeding your baby again. Pure lanolin is not toxic to your baby. You may also hand express a few drops of breast milk and gently massage that milk into your nipples and allow the milk to air dry. In the first few weeks after giving birth, some women   experience extremely full breasts (engorgement). Engorgement can make your breasts feel heavy, warm, and tender to the touch. Engorgement peaks within 3 5 days after you give birth. The following recommendations can help ease engorgement:  Completely empty your breasts while breastfeeding or pumping. You may want to start by applying warm, moist heat (in the shower or with warm water-soaked hand towels) just before feeding or pumping. This increases circulation and helps the milk flow. If your baby does not completely empty your breasts while breastfeeding, pump any extra  milk after he or she is finished.  Wear a snug bra (nursing or regular) or tank top for 1 2 days to signal your body to slightly decrease milk production.  Apply ice packs to your breasts, unless this is too uncomfortable for you.  Make sure that your baby is latched on and positioned properly while breastfeeding. If engorgement persists after 48 hours of following these recommendations, contact your health care provider or a lactation consultant. OVERALL HEALTH CARE RECOMMENDATIONS WHILE BREASTFEEDING  Eat healthy foods. Alternate between meals and snacks, eating 3 of each per day. Because what you eat affects your breast milk, some of the foods may make your baby more irritable than usual. Avoid eating these foods if you are sure that they are negatively affecting your baby.  Drink milk, fruit juice, and water to satisfy your thirst (about 10 glasses a day).   Rest often, relax, and continue to take your prenatal vitamins to prevent fatigue, stress, and anemia.  Continue breast self-awareness checks.  Avoid chewing and smoking tobacco.  Avoid alcohol and drug use. Some medicines that may be harmful to your baby can pass through breast milk. It is important to ask your health care provider before taking any medicine, including all over-the-counter and prescription medicine as well as vitamin and herbal supplements. It is possible to become pregnant while breastfeeding. If birth control is desired, ask your health care provider about options that will be safe for your baby. SEEK MEDICAL CARE IF:   You feel like you want to stop breastfeeding or have become frustrated with breastfeeding.  You have painful breasts or nipples.  Your nipples are cracked or bleeding.  Your breasts are red, tender, or warm.  You have a swollen area on either breast.  You have a fever or chills.  You have nausea or vomiting.  You have drainage other than breast milk from your nipples.  Your breasts  do not become full before feedings by the 5th day after you give birth.  You feel sad and depressed.  Your baby is too sleepy to eat well.  Your baby is having trouble sleeping.   Your baby is wetting less than 3 diapers in a 24-hour period.  Your baby has less than 3 stools in a 24-hour period.  Your baby's skin or the white part of his or her eyes becomes yellow.   Your baby is not gaining weight by 5 days of age. SEEK IMMEDIATE MEDICAL CARE IF:   Your baby is overly tired (lethargic) and does not want to wake up and feed.  Your baby develops an unexplained fever. Document Released: 04/14/2005 Document Revised: 12/15/2012 Document Reviewed: 10/06/2012 [employer] Patient Information 2014 [employer].  

## 2013-04-26 NOTE — Progress Notes (Signed)
Doing well. Hips are hurting.  Advised methods to help with pain.

## 2013-04-26 NOTE — Assessment & Plan Note (Signed)
Continue routine prenatal care.  

## 2013-04-26 NOTE — Progress Notes (Signed)
P - 67 

## 2013-04-28 NOTE — L&D Delivery Note (Signed)
Delivery Note At 7:30 AM a viable and healthy female was delivered via Vaginal, Precipitous Spontaneous Delivery (Presentation: Left Occiput Anterior, compound arm).  APGAR: 9, 9; weight pending.   Placenta status: spontaneous, intact.  Cord: 3 vessels with the following complications: None.  Cord pH: NA  Moderate gush of blood after delivery and w/ placenta, but firm w/ massage. Small amount of bleeding to follow. Will CYT closely.  Anesthesia: None  Episiotomy: None Lacerations: 2nd degree Suture Repair: 3.0 vicryl rapide Est. Blood Loss (mL): 500  Mom to postpartum.  Baby to Couplet care / Skin to Skin. Breastfeeding  Joan Orozco 06/22/2013, 8:54 AM

## 2013-05-10 ENCOUNTER — Encounter: Payer: Self-pay | Admitting: Obstetrics & Gynecology

## 2013-05-10 ENCOUNTER — Ambulatory Visit (INDEPENDENT_AMBULATORY_CARE_PROVIDER_SITE_OTHER): Payer: Medicaid Other | Admitting: Obstetrics & Gynecology

## 2013-05-10 VITALS — BP 115/61 | Wt 167.6 lb

## 2013-05-10 DIAGNOSIS — F329 Major depressive disorder, single episode, unspecified: Secondary | ICD-10-CM | POA: Insufficient documentation

## 2013-05-10 DIAGNOSIS — F3289 Other specified depressive episodes: Secondary | ICD-10-CM

## 2013-05-10 DIAGNOSIS — O99340 Other mental disorders complicating pregnancy, unspecified trimester: Secondary | ICD-10-CM

## 2013-05-10 DIAGNOSIS — Z3480 Encounter for supervision of other normal pregnancy, unspecified trimester: Secondary | ICD-10-CM

## 2013-05-10 MED ORDER — LORAZEPAM 0.5 MG PO TABS: 0.5000 mg | ORAL_TABLET | Freq: Three times a day (TID) | ORAL | Status: DC

## 2013-05-10 NOTE — Progress Notes (Signed)
Patient has no plans to harm herself or others; she is able to rationalize through any such thoughts.  Does not want medication, wants to talk to a specialist. Referral to psychologist ordered.  Declines any SW referral.  No other complaints or concerns.  Fetal movement and labor precautions reviewed.

## 2013-05-10 NOTE — Addendum Note (Signed)
Addended by: Barbara CowerNOGUES, Cyril Railey L on: 05/10/2013 03:42 PM   Modules accepted: Orders

## 2013-05-10 NOTE — Progress Notes (Signed)
P-70 Patient is concerned that her anxiety and depression has worsened.  She has suffered depression in the past as a teen and so she is familiar with how she is feeling.

## 2013-05-10 NOTE — Patient Instructions (Signed)
Return to clinic for any obstetric concerns or go to [facility] for evaluation  

## 2013-05-17 ENCOUNTER — Encounter: Payer: Self-pay | Admitting: Obstetrics & Gynecology

## 2013-05-17 ENCOUNTER — Ambulatory Visit (INDEPENDENT_AMBULATORY_CARE_PROVIDER_SITE_OTHER): Payer: Medicaid Other | Admitting: Obstetrics & Gynecology

## 2013-05-17 VITALS — BP 106/55 | Wt 166.8 lb

## 2013-05-17 DIAGNOSIS — Z3480 Encounter for supervision of other normal pregnancy, unspecified trimester: Secondary | ICD-10-CM

## 2013-05-17 NOTE — Progress Notes (Signed)
Unscheduled OB visit. She has had pressure since last night, no VB, ROM. She had sex a couple of days of ago. I have discussed PTL s/sx. Her only other pregnancy went to 41+ weeks, requiring a 2 day induction.

## 2013-05-17 NOTE — Progress Notes (Signed)
P73  Patient is having increased pressure that felt comparable to when she was in labor with her first son.

## 2013-05-24 ENCOUNTER — Inpatient Hospital Stay (HOSPITAL_COMMUNITY)
Admission: AD | Admit: 2013-05-24 | Discharge: 2013-05-24 | Disposition: A | Discharge status: Home / Self Care | Payer: Medicaid Other | Source: Ambulatory Visit | Attending: Obstetrics & Gynecology | Admitting: Obstetrics & Gynecology

## 2013-05-24 ENCOUNTER — Encounter (HOSPITAL_COMMUNITY): Payer: Self-pay

## 2013-05-24 DIAGNOSIS — O99891 Other specified diseases and conditions complicating pregnancy: Secondary | ICD-10-CM | POA: Insufficient documentation

## 2013-05-24 DIAGNOSIS — O9989 Other specified diseases and conditions complicating pregnancy, childbirth and the puerperium: Principal | ICD-10-CM

## 2013-05-24 DIAGNOSIS — O368190 Decreased fetal movements, unspecified trimester, not applicable or unspecified: Secondary | ICD-10-CM | POA: Insufficient documentation

## 2013-05-24 DIAGNOSIS — O4700 False labor before 37 completed weeks of gestation, unspecified trimester: Secondary | ICD-10-CM | POA: Insufficient documentation

## 2013-05-24 DIAGNOSIS — Z3480 Encounter for supervision of other normal pregnancy, unspecified trimester: Secondary | ICD-10-CM

## 2013-05-24 DIAGNOSIS — Y92009 Unspecified place in unspecified non-institutional (private) residence as the place of occurrence of the external cause: Secondary | ICD-10-CM | POA: Insufficient documentation

## 2013-05-24 DIAGNOSIS — W860XXA Exposure to domestic wiring and appliances, initial encounter: Secondary | ICD-10-CM | POA: Insufficient documentation

## 2013-05-24 DIAGNOSIS — T754XXA Electrocution, initial encounter: Secondary | ICD-10-CM

## 2013-05-24 DIAGNOSIS — Z87891 Personal history of nicotine dependence: Secondary | ICD-10-CM | POA: Insufficient documentation

## 2013-05-24 HISTORY — DX: Migraine, unspecified, not intractable, without status migrainosus: G43.909
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
## 2013-05-24 NOTE — Discharge Instructions (Signed)
Electric Shock Injury °Electric shock injuries may be caused by lightning or electricity (current) passing through the body. The amount of injury depends on the current's pressure (voltage), the amount of current (amperage), the type of current (direct vs. alternating), the body's resistance to the current, the current's path through the body, and how long the body remains in contact with the current. Current is the flow of electricity. Electricity may produce effects ranging from barely noticeable tingling to instant death; every part of the body is vulnerable.  °The harshness of injury depends mostly on the voltage. Low voltage can be as dangerous as high voltage under the right circumstances. People have been killed by shocks of just 50 volts. °WHAT DETERMINES THE EFFECTS OF ELECTRICITY? °How electric shocks affect the skin is determined by the skin's resistance. This is the skin's ability to stay unharmed by a shock. This, in turn, depends upon the wetness, dryness, thickness and or cleanliness of the skin. Thin or wet skin is much less resistant than thick or dry skin. When skin resistance is low, the current may cause little or no skin damage but may severely burn internal organs and tissues. Conversely, high skin resistance, such as with dry thick skin, can produce severe skin burns but decreases the current entering the body. °WHAT PARTS OF THE BODY DOES ELECTRICITY AFFECT THE MOST? °· The nervous system (the brain, spinal cord, and nerves) are most helpless to the effects of electricity and most often harmed in electrical injury. Some damage is minor and clears up on its own or with treatment. Sometimes the damage is severe and will be permanent. Neurological problems may be apparent immediately after the accident, or gradually develop over a period of up to three years. °· Damage to the respiratory and cardiovascular systems happens immediately. Electric shocks can paralyze the respiratory system (stop  breathing) or disrupt heart action (cause the heart to beat irregularly or stop). This may cause instant death. Smaller veins and arteries, which get hot more easily than the larger blood vessels, are at greater risk. They can develop blood clots. Damage to the smaller vessels is a common cause of amputation following high-voltage injuries. °· Other injuries may include cataracts, kidney failure, and injury to muscle tissue. An electric arc may set clothing and flammable substances on fire which may cause burns. Strong shocks are often accompanied by violent muscle spasms that can break and dislocate bones. These spasms can also freeze the victim in place and prevent him or her from breaking away from the current. °DIAGNOSIS  °Diagnosis relies on information about the cause of the accident, physical examination, and close monitoring of the heart, lungs, neurological condition and kidney activity. These conditions can change rapidly so close observation is necessary. Magnetic resonance imaging (MRI) may be necessary to check for brain injury. °TREATMENT  °· When an electrical accident happens at home or in the workplace, emergency medical help should be summoned as quickly as possible. The main power should immediately be shut off. If that cannot be done, and current is still flowing through the victim, stand on a dry, non-conducting surface such as a folded newspaper, flattened cardboard carton, or plastic or rubber mat. Use a non-conducting object such as a wooden broomstick (never a damp or metallic object) to push the victim away from the source of the current. Non-conducting means the substance will not pass electricity easily through it. Do not touch the victim or electrical source while the current is still flowing. This   may electrocute the rescuer.  If the victim is faint, pale, or showing signs of shock, lay the victim down, with the legs elevated above the level of the chest. Warm the person with a  blanket.  If a pulse can not be felt, or the person is not breathing, someone trained in cardiopulmonary resuscitation (CPR) should begin CPR. Continue this until help arrives.  If the victim is burned, remove clothing that comes off easily. Rinse the burned area in cool water for pain relief. Give first aid for burns. Burns often require treatment at a burn center.  Electrical injury can be associated with explosions or falls that can cause other injuries. Avoid moving the head or neck if an injury to this area is suspected.  Give first aid as needed for other wounds or fractures.  Fluid replacement therapy is necessary to restore lost fluids and electrolytes. Severely injured tissue is repaired surgically.  Antibiotics and antibacterial creams are used to prevent infection.  Kidney failure may need to be treated.  Physical therapy may help recovery along with counseling if there is disfigurement. PROGNOSIS   Electric shocks may cause death.  Survivors may require amputation. Cosmetic problems may result along with disfigurement.  Injuries from household appliances and other low-voltage sources are less likely to produce extreme damage. PREVENTION   Know electrical dangers in your home.  Damaged electric appliances, wiring, cords, and plugs should be repaired or replaced. Electrical repairs should be attempted only by people with the proper training.  Hair dryers, radios, and other electric appliances should never be used in the bathroom or anywhere else they might accidentally come in contact with water. Water and pipes create a ground and the electricity picks the easiest way to go to ground which can be through your body.  Young children need to be kept away from electric appliances and should be taught about the dangers of electricity as soon as they are old enough.  Electric outlets require safety covers in homes with young children.  During lightning and thunder storms, go  indoors immediately, even if no rain is falling. Boaters should return to shore as rapidly as possible.  If the hair on your head or arms stands on end during a storm, seek cover as rapidly as possible as a lightning strike may be about to happen.  If you cannot reach indoor shelter, stay away from metallic objects such as golf clubs or fishing rods and lie down in low-ground areas. Standing or lying under or next to tall or metallic structures is unsafe. For example, it is unsafe to stand under a tree during a lightning storm. Do not stand next to long conductors of electricity such as wire fences.  An automobile is appropriate cover, as long as the radio is off.  Telephones, computers, hair dryers, and other appliances that can act as channels for lightning should not be used during a thunder storm.  During storms, stay away from screens and metal that may conduct electricity from the outside. SEEK IMMEDIATE MEDICAL CARE IF:  You develop chest pain.  A part of your arms or legs becomes very swollen or painful.  One of your arms or legs appears pale, cool, or discolored.  Your urine becomes discolored, or you are not urinating as much as usual.  You develop severe abdominal pain. Document Released: 04/17/2003 Document Revised: 07/07/2011 Document Reviewed: 05/18/2008 [facility] Patient Information 2014 [place], [place]. Fetal Movement Counts Patient Name: __________________________________________________ Patient Due Date: ____________________ Performing a  fetal movement count is highly recommended in high-risk pregnancies, but it is good for every pregnant woman to do. Your caregiver may ask you to start counting fetal movements at 28 weeks of the pregnancy. Fetal movements often increase:  After eating a full meal.  After physical activity.  After eating or drinking something sweet or cold.  At rest. Pay attention to when you feel the baby is most active. This will help you notice  a pattern of your baby's sleep and wake cycles and what factors contribute to an increase in fetal movement. It is important to perform a fetal movement count at the same time each day when your baby is normally most active.  HOW TO COUNT FETAL MOVEMENTS 1. Find a quiet and comfortable area to sit or lie down on your left side. Lying on your left side provides the best blood and oxygen circulation to your baby. 2. Write down the day and time on a sheet of paper or in a journal. 3. Start counting kicks, flutters, swishes, rolls, or jabs in a 2 hour period. You should feel at least 10 movements within 2 hours. 4. If you do not feel 10 movements in 2 hours, wait 2 3 hours and count again. Look for a change in the pattern or not enough counts in 2 hours. SEEK MEDICAL CARE IF:  You feel less than 10 counts in 2 hours, tried twice.  There is no movement in over an hour.  The pattern is changing or taking longer each day to reach 10 counts in 2 hours.  You feel the baby is not moving as he or she usually does. Date: ____________ Movements: ____________ Start time: ____________ Joan MartinFinish time: ____________  Date: ____________ Movements: ____________ Start time: ____________ Joan MartinFinish time: ____________ Date: ____________ Movements: ____________ Start time: ____________ Joan MartinFinish time: ____________ Date: ____________ Movements: ____________ Start time: ____________ Joan MartinFinish time: ____________ Date: ____________ Movements: ____________ Start time: ____________ Joan MartinFinish time: ____________ Date: ____________ Movements: ____________ Start time: ____________ Joan MartinFinish time: ____________ Date: ____________ Movements: ____________ Start time: ____________ Joan MartinFinish time: ____________ Date: ____________ Movements: ____________ Start time: ____________ Joan MartinFinish time: ____________  Date: ____________ Movements: ____________ Start time: ____________ Joan MartinFinish time: ____________ Date: ____________ Movements: ____________ Start time:  ____________ Joan MartinFinish time: ____________ Date: ____________ Movements: ____________ Start time: ____________ Joan MartinFinish time: ____________ Date: ____________ Movements: ____________ Start time: ____________ Joan MartinFinish time: ____________ Date: ____________ Movements: ____________ Start time: ____________ Joan MartinFinish time: ____________ Date: ____________ Movements: ____________ Start time: ____________ Joan MartinFinish time: ____________ Date: ____________ Movements: ____________ Start time: ____________ Joan MartinFinish time: ____________  Date: ____________ Movements: ____________ Start time: ____________ Joan MartinFinish time: ____________ Date: ____________ Movements: ____________ Start time: ____________ Joan MartinFinish time: ____________ Date: ____________ Movements: ____________ Start time: ____________ Joan MartinFinish time: ____________ Date: ____________ Movements: ____________ Start time: ____________ Joan MartinFinish time: ____________ Date: ____________ Movements: ____________ Start time: ____________ Joan MartinFinish time: ____________ Date: ____________ Movements: ____________ Start time: ____________ Joan MartinFinish time: ____________ Date: ____________ Movements: ____________ Start time: ____________ Joan MartinFinish time: ____________  Date: ____________ Movements: ____________ Start time: ____________ Joan MartinFinish time: ____________ Date: ____________ Movements: ____________ Start time: ____________ Joan MartinFinish time: ____________ Date: ____________ Movements: ____________ Start time: ____________ Joan MartinFinish time: ____________ Date: ____________ Movements: ____________ Start time: ____________ Joan MartinFinish time: ____________ Date: ____________ Movements: ____________ Start time: ____________ Joan MartinFinish time: ____________ Date: ____________ Movements: ____________ Start time: ____________ Joan MartinFinish time: ____________ Date: ____________ Movements: ____________ Start time: ____________ Joan MartinFinish time: ____________  Date: ____________ Movements: ____________ Start time: ____________ Joan MartinFinish time: ____________ Date:  ____________ Movements: ____________ Start time: ____________ Joan MartinFinish time:  ____________ Date: ____________ Movements: ____________ Start time: ____________ Joan Orozco time: ____________ Date: ____________ Movements: ____________ Start time: ____________ Joan Orozco time: ____________ Date: ____________ Movements: ____________ Start time: ____________ Joan Orozco time: ____________ Date: ____________ Movements: ____________ Start time: ____________ Joan Orozco time: ____________ Date: ____________ Movements: ____________ Start time: ____________ Joan Orozco time: ____________  Date: ____________ Movements: ____________ Start time: ____________ Joan Orozco time: ____________ Date: ____________ Movements: ____________ Start time: ____________ Joan Orozco time: ____________ Date: ____________ Movements: ____________ Start time: ____________ Joan Orozco time: ____________ Date: ____________ Movements: ____________ Start time: ____________ Joan Orozco time: ____________ Date: ____________ Movements: ____________ Start time: ____________ Joan Orozco time: ____________ Date: ____________ Movements: ____________ Start time: ____________ Joan Orozco time: ____________ Date: ____________ Movements: ____________ Start time: ____________ Joan Orozco time: ____________  Date: ____________ Movements: ____________ Start time: ____________ Joan Orozco time: ____________ Date: ____________ Movements: ____________ Start time: ____________ Joan Orozco time: ____________ Date: ____________ Movements: ____________ Start time: ____________ Joan Orozco time: ____________ Date: ____________ Movements: ____________ Start time: ____________ Joan Orozco time: ____________ Date: ____________ Movements: ____________ Start time: ____________ Joan Orozco time: ____________ Date: ____________ Movements: ____________ Start time: ____________ Joan Orozco time: ____________ Date: ____________ Movements: ____________ Start time: ____________ Joan Orozco time: ____________  Date: ____________ Movements: ____________ Start  time: ____________ Joan Orozco time: ____________ Date: ____________ Movements: ____________ Start time: ____________ Joan Orozco time: ____________ Date: ____________ Movements: ____________ Start time: ____________ Joan Orozco time: ____________ Date: ____________ Movements: ____________ Start time: ____________ Joan Orozco time: ____________ Date: ____________ Movements: ____________ Start time: ____________ Joan Orozco time: ____________ Date: ____________ Movements: ____________ Start time: ____________ Joan Orozco time: ____________ Document Released: 05/14/2006 Document Revised: 03/31/2012 Document Reviewed: 02/09/2012 [employer] Patient Information 2014 [employer].
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
## 2013-05-26 ENCOUNTER — Encounter: Payer: Self-pay | Admitting: Obstetrics & Gynecology

## 2013-05-26 ENCOUNTER — Ambulatory Visit (INDEPENDENT_AMBULATORY_CARE_PROVIDER_SITE_OTHER): Payer: Medicaid Other | Admitting: Obstetrics & Gynecology

## 2013-05-26 VITALS — BP 97/69 | Wt 166.2 lb

## 2013-05-26 DIAGNOSIS — Z3480 Encounter for supervision of other normal pregnancy, unspecified trimester: Secondary | ICD-10-CM

## 2013-05-26 LAB — OB RESULTS CONSOLE GC/CHLAMYDIA
CHLAMYDIA, DNA PROBE: NEGATIVE
GC PROBE AMP, GENITAL: NEGATIVE

## 2013-05-26 LAB — OB RESULTS CONSOLE GBS: STREP GROUP B AG: POSITIVE
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
## 2013-05-26 NOTE — Progress Notes (Signed)
Routine OB visit. Good FM. Labor precautions reviewed. Cervical cultures obtained today.

## 2013-05-27 LAB — GC/CHLAMYDIA PROBE AMP, URINE
Chlamydia, Swab/Urine, PCR: NEGATIVE
GC Probe Amp, Urine: NEGATIVE

## 2013-05-28 LAB — CULTURE, BETA STREP (GROUP B ONLY)

## 2013-05-30 ENCOUNTER — Encounter (HOSPITAL_COMMUNITY): Payer: Self-pay | Admitting: *Deleted

## 2013-05-30 ENCOUNTER — Inpatient Hospital Stay (HOSPITAL_COMMUNITY)
Admission: AD | Admit: 2013-05-30 | Discharge: 2013-05-30 | Disposition: A | Discharge status: Home / Self Care | Payer: Medicaid Other | Source: Ambulatory Visit | Attending: Obstetrics & Gynecology | Admitting: Obstetrics & Gynecology

## 2013-05-30 ENCOUNTER — Encounter: Payer: Self-pay | Admitting: Obstetrics & Gynecology

## 2013-05-30 DIAGNOSIS — H539 Unspecified visual disturbance: Secondary | ICD-10-CM | POA: Insufficient documentation

## 2013-05-30 DIAGNOSIS — F329 Major depressive disorder, single episode, unspecified: Secondary | ICD-10-CM

## 2013-05-30 DIAGNOSIS — R410 Disorientation, unspecified: Secondary | ICD-10-CM

## 2013-05-30 DIAGNOSIS — Z3480 Encounter for supervision of other normal pregnancy, unspecified trimester: Secondary | ICD-10-CM

## 2013-05-30 DIAGNOSIS — O9989 Other specified diseases and conditions complicating pregnancy, childbirth and the puerperium: Principal | ICD-10-CM

## 2013-05-30 DIAGNOSIS — Z87891 Personal history of nicotine dependence: Secondary | ICD-10-CM | POA: Insufficient documentation

## 2013-05-30 DIAGNOSIS — R42 Dizziness and giddiness: Secondary | ICD-10-CM | POA: Insufficient documentation

## 2013-05-30 DIAGNOSIS — O99340 Other mental disorders complicating pregnancy, unspecified trimester: Secondary | ICD-10-CM

## 2013-05-30 DIAGNOSIS — F32A Depression, unspecified: Secondary | ICD-10-CM

## 2013-05-30 DIAGNOSIS — F411 Generalized anxiety disorder: Secondary | ICD-10-CM

## 2013-05-30 DIAGNOSIS — G43909 Migraine, unspecified, not intractable, without status migrainosus: Secondary | ICD-10-CM

## 2013-05-30 DIAGNOSIS — O99891 Other specified diseases and conditions complicating pregnancy: Secondary | ICD-10-CM | POA: Insufficient documentation

## 2013-05-30 NOTE — Discharge Instructions (Signed)
Third Trimester of Pregnancy  The third trimester is from week 29 through week 42, months 7 through 9. The third trimester is a time when the fetus is growing rapidly. At the end of the ninth month, the fetus is about 20 inches in length and weighs 6 10 pounds.   BODY CHANGES  Your body goes through many changes during pregnancy. The changes vary from woman to woman.    Your weight will continue to increase. You can expect to gain 25 35 pounds (11 16 kg) by the end of the pregnancy.   You may begin to get stretch marks on your hips, abdomen, and breasts.   You may urinate more often because the fetus is moving lower into your pelvis and pressing on your bladder.   You may develop or continue to have heartburn as a result of your pregnancy.   You may develop constipation because certain hormones are causing the muscles that push waste through your intestines to slow down.   You may develop hemorrhoids or swollen, bulging veins (varicose veins).   You may have pelvic pain because of the weight gain and pregnancy hormones relaxing your joints between the bones in your pelvis. Back aches may result from over exertion of the muscles supporting your posture.   Your breasts will continue to grow and be tender. A yellow discharge may leak from your breasts called colostrum.   Your belly button may stick out.   You may feel short of breath because of your expanding uterus.   You may notice the fetus "dropping," or moving lower in your abdomen.   You may have a bloody mucus discharge. This usually occurs a few days to a week before labor begins.   Your cervix becomes thin and soft (effaced) near your due date.  WHAT TO EXPECT AT YOUR PRENATAL EXAMS   You will have prenatal exams every 2 weeks until week 36. Then, you will have weekly prenatal exams. During a routine prenatal visit:   You will be weighed to make sure you and the fetus are growing normally.   Your blood pressure is taken.   Your abdomen will be  measured to track your baby's growth.   The fetal heartbeat will be listened to.   Any test results from the previous visit will be discussed.   You may have a cervical check near your due date to see if you have effaced.  At around 36 weeks, your caregiver will check your cervix. At the same time, your caregiver will also perform a test on the secretions of the vaginal tissue. This test is to determine if a type of bacteria, Group B streptococcus, is present. Your caregiver will explain this further.  Your caregiver may ask you:   What your birth plan is.   How you are feeling.   If you are feeling the baby move.   If you have had any abnormal symptoms, such as leaking fluid, bleeding, severe headaches, or abdominal cramping.   If you have any questions.  Other tests or screenings that may be performed during your third trimester include:   Blood tests that check for low iron levels (anemia).   Fetal testing to check the health, activity level, and growth of the fetus. Testing is done if you have certain medical conditions or if there are problems during the pregnancy.  FALSE LABOR  You may feel small, irregular contractions that eventually go away. These are called Braxton Hicks contractions, or   false labor. Contractions may last for hours, days, or even weeks before true labor sets in. If contractions come at regular intervals, intensify, or become painful, it is best to be seen by your caregiver.   SIGNS OF LABOR    Menstrual-like cramps.   Contractions that are 5 minutes apart or less.   Contractions that start on the top of the uterus and spread down to the lower abdomen and back.   A sense of increased pelvic pressure or back pain.   A watery or bloody mucus discharge that comes from the vagina.  If you have any of these signs before the 37th week of pregnancy, call your caregiver right away. You need to go to the hospital to get checked immediately.  HOME CARE INSTRUCTIONS    Avoid all  smoking, herbs, alcohol, and unprescribed drugs. These chemicals affect the formation and growth of the baby.   Follow your caregiver's instructions regarding medicine use. There are medicines that are either safe or unsafe to take during pregnancy.   Exercise only as directed by your caregiver. Experiencing uterine cramps is a good sign to stop exercising.   Continue to eat regular, healthy meals.   Wear a good support bra for breast tenderness.   Do not use hot tubs, steam rooms, or saunas.   Wear your seat belt at all times when driving.   Avoid raw meat, uncooked cheese, cat litter boxes, and soil used by cats. These carry germs that can cause birth defects in the baby.   Take your prenatal vitamins.   Try taking a stool softener (if your caregiver approves) if you develop constipation. Eat more high-fiber foods, such as fresh vegetables or fruit and whole grains. Drink plenty of fluids to keep your urine clear or pale yellow.   Take warm sitz baths to soothe any pain or discomfort caused by hemorrhoids. Use hemorrhoid cream if your caregiver approves.   If you develop varicose veins, wear support hose. Elevate your feet for 15 minutes, 3 4 times a day. Limit salt in your diet.   Avoid heavy lifting, wear low heal shoes, and practice good posture.   Rest a lot with your legs elevated if you have leg cramps or low back pain.   Visit your dentist if you have not gone during your pregnancy. Use a soft toothbrush to brush your teeth and be gentle when you floss.   A sexual relationship may be continued unless your caregiver directs you otherwise.   Do not travel far distances unless it is absolutely necessary and only with the approval of your caregiver.   Take prenatal classes to understand, practice, and ask questions about the labor and delivery.   Make a trial run to the hospital.   Pack your hospital bag.   Prepare the baby's nursery.   Continue to go to all your prenatal visits as directed  by your caregiver.  SEEK MEDICAL CARE IF:   You are unsure if you are in labor or if your water has broken.   You have dizziness.   You have mild pelvic cramps, pelvic pressure, or nagging pain in your abdominal area.   You have persistent nausea, vomiting, or diarrhea.   You have a bad smelling vaginal discharge.   You have pain with urination.  SEEK IMMEDIATE MEDICAL CARE IF:    You have a fever.   You are leaking fluid from your vagina.   You have spotting or bleeding from your vagina.     You have severe abdominal cramping or pain.   You have rapid weight loss or gain.   You have shortness of breath with chest pain.   You notice sudden or extreme swelling of your face, hands, ankles, feet, or legs.   You have not felt your baby move in over an hour.   You have severe headaches that do not go away with medicine.   You have vision changes.  Document Released: 04/08/2001 Document Revised: 12/15/2012 Document Reviewed: 06/15/2012  [employer] Patient Information 2014 [employer].

## 2013-05-30 NOTE — MAU Provider Note (Signed)
Chief Complaint:  Dizziness   Joan Orozco is a 28 y.o.  G2P1001 with IUP at 3981w4d presenting for vision changes, confusion, Dizziness  Pt states that 4 hours ago, she noticed that she had a ring in her vision on left eye. It continued to progress and then she was unable to see out of the lower half of her vision on that side. She felt somewhat confused also. Called her husband who stated she was somewhat more confused on the phone and said "weekend" instead of "women's". By the time they got here, her symptoms had resolved.  She still feels a little bit "fuzzy" but her vision is normal, she has no weakness.    No fevers, chills, changes in mood/behavior, weakness, numbness, changes in taste or hearing.  No nausea, vomiting, HA, diarrhea, constipation.   + increased anxiety     No vb, lof, ctx. +FM.    Menstrual History: OB History   Grav Para Term Preterm Abortions TAB SAB Ect Mult Living   2 1 1  0 0 0 0 0 0 1       Patient's last menstrual period was 09/23/2012.      Past Medical History  Diagnosis Date  . Anxiety     at younger age. was on lexapro  . Infection     bladder, yeast, uri.  . Asthma     childhood  . Abnormal Pap smear     repeat WNL  . History of Bell's palsy   . Migraine     Past Surgical History  Procedure Laterality Date  . Wisdom tooth extraction    . Tooth extraction      Family History  Problem Relation Age of Onset  . Adopted: Yes    History  Substance Use Topics  . Smoking status: Former Smoker    Types: Cigarettes    Quit date: 10/15/2004  . Smokeless tobacco: Never Used  . Alcohol Use: No     Comment: social      Allergies  Allergen Reactions  . Cefdinir [Omni-Pac] Rash    RN says MD aware of allergy & ok w/ Ampicillin.    Prescriptions prior to admission  Medication Sig Dispense Refill  . acetaminophen (TYLENOL) 500 MG tablet Take 1,000 mg by mouth every 6 (six) hours as needed for moderate pain. For heartburn       . calcium carbonate (TUMS - DOSED IN MG ELEMENTAL CALCIUM) 500 MG chewable tablet Chew 1-2 tablets by mouth 2 (two) times daily as needed for indigestion or heartburn.      Tresa Res. Lavender Oil OIL Apply 1 application topically daily as needed (For relaxation.).      Marland Kitchen. LORazepam (ATIVAN) 0.5 MG tablet Take 1 tablet (0.5 mg total) by mouth every 8 (eight) hours. As needed for anxiety  30 tablet  2  . omeprazole (PRILOSEC) 10 MG capsule Take 10 mg by mouth daily.      . Prenatal Vit-Fe Fumarate-FA (PRENATAL MULTIVITAMIN) TABS tablet Take 1 tablet by mouth daily at 12 noon.      . propranolol (INDERAL) 10 MG tablet Take 10 mg by mouth 2 (two) times daily.        Review of Systems - Negative except for what is mentioned in HPI.  Physical Exam  Height 5\' 3"  (1.6 m), weight 75.297 kg (166 lb), last menstrual period 09/23/2012, currently breastfeeding. GENERAL: Well-developed, well-nourished female in no acute distress. Tearful at times, appears slightly anxious.  LUNGS: Clear  to auscultation bilaterally.  HEART: Regular rate and rhythm. ABDOMEN: Soft, nontender, nondistended, gravid.  NEURO: A+O x3, concentration intact to serial 7s, able to name the president, repetition intact,  CN II-XII intact, 5/5 strength in upper and lower extremities. 2+ DTRs. Normal sensation  EXTREMITIES: Nontender, no edema, 2+ distal pulses. FHT:  Baseline rate 120 bpm   Variability moderate  Accelerations present   Decelerations none Contractions: occasional irritability.    Labs: No results found for this or any previous visit (from the past 24 hour(s)).  Imaging Studies:  No results found.  Assessment: Joan Orozco is  28 y.o. G2P1001 at [redacted]w[redacted]d presents with Dizziness  Pt concerned for stroke like symptoms. Now symptoms resolved. In exam, pt was tearful with some inconsistencies in her exam from time to time.  After asking pt's husband to leave the room, pt became more relaxed and calm and stated her stress  and anxiety has worsened. Feels unsupported at times at home but does feel safe. No hx of domestic violence.  No SI/HI.   Symptoms could have been due to hypoglycemia vs. Hypotension vs. Atypical migraine vs. TIA. However the most likely diagnosis is due to stress and anxiety.  Pt agreed this was highly likely due to she had just had a difficult conversation with her mother-in-law.    Plan:  - reassurance given - discussed need to have a support system - pt has an rx for ativan but hasn't taken them.  Discussed when to use but also discussed risks in pregnancy - f/u as scheduled for [facility].  - reasons to return discussed also.  - FWB- cat I tracing  Clinten Howk L 2/2/20159:00 PM

## 2013-05-30 NOTE — MAU Note (Signed)
Starting having spots before eye left eye.  Face feels slightly sluggish.  Feels really dizzy

## 2013-05-31 NOTE — MAU Provider Note (Signed)
Attestation of Attending Supervision of Fellow: Evaluation and management procedures were performed by the Fellow under my supervision and collaboration.  I have reviewed the Fellow's note and chart, and I agree with the management and plan.    

## 2013-06-02 ENCOUNTER — Ambulatory Visit (INDEPENDENT_AMBULATORY_CARE_PROVIDER_SITE_OTHER): Payer: Medicaid Other | Admitting: Obstetrics & Gynecology

## 2013-06-02 ENCOUNTER — Encounter: Payer: Self-pay | Admitting: Obstetrics & Gynecology

## 2013-06-02 VITALS — BP 98/56 | Wt 170.0 lb

## 2013-06-02 DIAGNOSIS — Z3480 Encounter for supervision of other normal pregnancy, unspecified trimester: Secondary | ICD-10-CM

## 2013-06-02 DIAGNOSIS — O99820 Streptococcus B carrier state complicating pregnancy: Secondary | ICD-10-CM

## 2013-06-02 DIAGNOSIS — O99340 Other mental disorders complicating pregnancy, unspecified trimester: Secondary | ICD-10-CM

## 2013-06-02 DIAGNOSIS — F3289 Other specified depressive episodes: Secondary | ICD-10-CM

## 2013-06-02 DIAGNOSIS — Z22330 Carrier of Group B streptococcus: Secondary | ICD-10-CM

## 2013-06-02 DIAGNOSIS — O09899 Supervision of other high risk pregnancies, unspecified trimester: Secondary | ICD-10-CM

## 2013-06-02 DIAGNOSIS — F329 Major depressive disorder, single episode, unspecified: Secondary | ICD-10-CM

## 2013-06-02 NOTE — Progress Notes (Signed)
P = 66 

## 2013-06-02 NOTE — Patient Instructions (Addendum)
Return to clinic for any obstetric concerns or go to MAU for evaluation  Group B Streptococcus Infection During Pregnancy Group B streptococcus (GBS) is a type of bacteria often found in healthy women. GBS usually does not cause any symptoms or harm to healthy adult women, but the bacteria can make a baby very sick if it is passed to the baby during childbirth. GBS is not a sexually transmitted disease (STD). GBS is different from Group A streptococcus, the bacteria that causes "strep throat." CAUSES  GBS bacteria can be found in the intestinal, reproductive, and urinary tracts of women. It can also be found in the female genital tract, most often in the vagina and rectal areas.  SYMPTOMS  In pregnancy, GBS can be in the following places:  Genital tract without symptoms.  Rectum without symptoms.  Urine with or without symptoms (asymptomatic bacteriuria).  Urinary symptoms can include pain, frequency, urgency, and blood with urination (cystitis). Pregnant women who are infected with GBS are at increased risk of:  Early (premature) labor and delivery.  Prolonged rupture of the membranes.  Infection in the following places:  Bladder.  Kidneys (pyelonephritis).  Bag of waters or placenta (chorioamnionitis).  Uterus (endometritis) after delivery. Newborns who are infected with GBS can develop:  Lung infection (pneumonia).  Blood infection (septicemia).  Infection of the lining of the brain and spinal cord (meningitis). DIAGNOSIS  Diagnosis of GBS infection is made by screening tests done when you are 35 to [redacted] weeks pregnant. The test (culture) is an easy swab of the vagina and rectum. A sample of your urine might also be checked for the bacteria. Talk with your caregiver about a plan for labor if your test shows that you carry the GBS bacteria. TREATMENT  If results of the culture are positive, showing that GBS is present, you likely will receive treatment with antibiotic  medicines during labor. This will help prevent GBS from being passed to your baby. The antibiotics work only if they are given during labor. If treatment is given earlier in pregnancy, the bacteria may regrow and be present during labor. Tell your caregiver if you are allergic to penicillin or other antibiotics. Antibiotics are also given if:   Infection of the membranes (amnionitis) is suspected.  Labor begins or there is rupture of the membranes before 37 weeks of pregnancy and there is a high risk of delivering the baby.  The mother has a past history of giving birth to an infant with GBS infection.  The culture status is unknown (culture not performed or result not available) and there is:  Fever during labor.  Preterm labor (less than 37 weeks of pregnancy).  Prolonged rupture of membranes (18 hours or more). Treatment of the mother during labor is not recommended when:  A planned cesarean delivery is done and there is no labor or ruptured membranes. This is true even if the mother tested positive for GBS.  There is a negative culture for GBS screening during the pregnancy, regardless of the risk factors during labor. The infant will receive antibiotics if the infant tested positive for GBS or has signs and symptoms that suggest GBS infection is present. It is not recommended to routinely give antibiotics to infants whose mothers received antibiotic treatment during labor. HOME CARE INSTRUCTIONS   Take all antibiotics as prescribed by your caregiver.  Only take medicine as directed by your caregiver.  Continue with prenatal visits and care.  Return for follow-up appointments and cultures.  Follow  your caregiver's instructions. SEEK MEDICAL CARE IF:   You have pain with urination.  You have frequent urination.  You have blood in your urine. SEEK IMMEDIATE MEDICAL CARE IF:  You have a fever.  You have pain in the back, side, or uterus.  You have chills.  You have  abdominal swelling (distension) or pain.  You have labor pains (contractions) every 10 minutes or more often.  You are leaking fluid or bleeding from your vagina.  You have pelvic pressure that feels like your baby is pushing down.  You have a low, dull backache.  You have cramps that feel like your period.  You have abdominal cramps with or without diarrhea.  You have repeated vomiting and diarrhea.  You have trouble breathing.  You develop confusion.  You have stiffness of your body or neck. MAKE SURE YOU:   Understand these instructions.  Will watch your condition.  Will get help right away if you are not doing well or get worse. Document Released: 07/22/2007 Document Revised: 07/07/2011 Document Reviewed: 08/25/2010 [facility] Patient Information 2014 [place], [place].

## 2013-06-02 NOTE — Progress Notes (Signed)
GBS positive, discussed with patient.  Will treat in labor.  No other complaints or concerns.  Fetal movement and labor precautions reviewed.

## 2013-06-09 ENCOUNTER — Encounter: Payer: Self-pay | Admitting: Obstetrics & Gynecology

## 2013-06-09 ENCOUNTER — Ambulatory Visit (INDEPENDENT_AMBULATORY_CARE_PROVIDER_SITE_OTHER): Payer: Medicaid Other | Admitting: Obstetrics & Gynecology

## 2013-06-09 VITALS — BP 116/62 | Wt 177.0 lb

## 2013-06-09 DIAGNOSIS — Z3480 Encounter for supervision of other normal pregnancy, unspecified trimester: Secondary | ICD-10-CM

## 2013-06-09 NOTE — Progress Notes (Signed)
Routine visit. Feels miserable and would love to be induced. I explained that we induce at 41 weeks/prn a medical indication. Labor precautions reviewed.

## 2013-06-09 NOTE — Progress Notes (Signed)
P-65 

## 2013-06-16 ENCOUNTER — Ambulatory Visit (INDEPENDENT_AMBULATORY_CARE_PROVIDER_SITE_OTHER): Payer: Medicaid Other | Admitting: Obstetrics and Gynecology

## 2013-06-16 ENCOUNTER — Encounter: Payer: Self-pay | Admitting: Obstetrics and Gynecology

## 2013-06-16 VITALS — BP 124/75 | Wt 173.0 lb

## 2013-06-16 DIAGNOSIS — G43909 Migraine, unspecified, not intractable, without status migrainosus: Secondary | ICD-10-CM

## 2013-06-16 DIAGNOSIS — Z3480 Encounter for supervision of other normal pregnancy, unspecified trimester: Secondary | ICD-10-CM

## 2013-06-16 NOTE — Progress Notes (Signed)
Patient is doing well without complaints. FM/labor precautions reviewed 

## 2013-06-16 NOTE — Progress Notes (Signed)
P-69

## 2013-06-22 ENCOUNTER — Encounter (HOSPITAL_COMMUNITY): Payer: Self-pay | Admitting: *Deleted

## 2013-06-22 ENCOUNTER — Inpatient Hospital Stay (HOSPITAL_COMMUNITY)
Admission: AD | Admit: 2013-06-22 | Discharge: 2013-06-23 | DRG: 775 | Disposition: A | Discharge status: Home / Self Care | Payer: Medicaid Other | Source: Ambulatory Visit | Attending: Obstetrics & Gynecology | Admitting: Obstetrics & Gynecology

## 2013-06-22 DIAGNOSIS — O9989 Other specified diseases and conditions complicating pregnancy, childbirth and the puerperium: Secondary | ICD-10-CM

## 2013-06-22 DIAGNOSIS — O99892 Other specified diseases and conditions complicating childbirth: Secondary | ICD-10-CM | POA: Diagnosis present

## 2013-06-22 DIAGNOSIS — Z22330 Carrier of Group B streptococcus: Secondary | ICD-10-CM

## 2013-06-22 DIAGNOSIS — IMO0001 Reserved for inherently not codable concepts without codable children: Secondary | ICD-10-CM

## 2013-06-22 LAB — RPR: RPR Ser Ql: NONREACTIVE

## 2013-06-22 LAB — POSTPARTUM HEMORRHAGE PROTOCOL (BB NOTIFICATION)

## 2013-06-22 LAB — CBC
HCT: 30.8 % — ABNORMAL LOW (ref 36.0–46.0)
Hemoglobin: 9.8 g/dL — ABNORMAL LOW (ref 12.0–15.0)
MCH: 24.6 pg — ABNORMAL LOW (ref 26.0–34.0)
MCHC: 31.8 g/dL (ref 30.0–36.0)
MCV: 77.4 fL — AB (ref 78.0–100.0)
Platelets: 224 10*3/uL (ref 150–400)
RBC: 3.98 MIL/uL (ref 3.87–5.11)
RDW: 15 % (ref 11.5–15.5)
WBC: 12.4 10*3/uL — AB (ref 4.0–10.5)

## 2013-06-22 LAB — ABO/RH: ABO/RH(D): A POS

## 2013-06-22 MED ORDER — ACETAMINOPHEN 325 MG PO TABS: 650.0000 mg | ORAL_TABLET | ORAL | Status: DC | PRN

## 2013-06-22 MED ORDER — MEASLES, MUMPS & RUBELLA VAC ~~LOC~~ INJ
0.5000 mL | INJECTION | Freq: Once | SUBCUTANEOUS | Status: DC
  Filled 2013-06-22: qty 0.5

## 2013-06-22 MED ORDER — METHYLERGONOVINE MALEATE 0.2 MG PO TABS: 0.2000 mg | ORAL_TABLET | ORAL | Status: DC | PRN

## 2013-06-22 MED ORDER — SUMATRIPTAN SUCCINATE 25 MG PO TABS
12.5000 mg | ORAL_TABLET | ORAL | Status: DC | PRN
  Filled 2013-06-22: qty 1

## 2013-06-22 MED ORDER — TETANUS-DIPHTH-ACELL PERTUSSIS 5-2.5-18.5 LF-MCG/0.5 IM SUSP: 0.5000 mL | Freq: Once | INTRAMUSCULAR | Status: DC

## 2013-06-22 MED ORDER — PENICILLIN G POTASSIUM 5000000 UNITS IJ SOLR: 5.0000 10*6.[IU] | Freq: Once | INTRAVENOUS | Status: DC

## 2013-06-22 MED ORDER — PRENATAL MULTIVITAMIN CH
1.0000 | ORAL_TABLET | Freq: Every day | ORAL | Status: DC
  Administered 2013-06-22: 1 via ORAL
  Filled 2013-06-22: qty 1

## 2013-06-22 MED ORDER — IBUPROFEN 600 MG PO TABS
600.0000 mg | ORAL_TABLET | Freq: Four times a day (QID) | ORAL | Status: DC
  Administered 2013-06-22 – 2013-06-23 (×3): 600 mg via ORAL
  Filled 2013-06-22 (×4): qty 1

## 2013-06-22 MED ORDER — WITCH HAZEL-GLYCERIN EX PADS: 1.0000 "application " | MEDICATED_PAD | CUTANEOUS | Status: DC | PRN

## 2013-06-22 MED ORDER — SIMETHICONE 80 MG PO CHEW: 80.0000 mg | CHEWABLE_TABLET | ORAL | Status: DC | PRN

## 2013-06-22 MED ORDER — ONDANSETRON HCL 4 MG PO TABS: 4.0000 mg | ORAL_TABLET | ORAL | Status: DC | PRN

## 2013-06-22 MED ORDER — BENZOCAINE-MENTHOL 20-0.5 % EX AERO
1.0000 "application " | INHALATION_SPRAY | CUTANEOUS | Status: DC | PRN
  Administered 2013-06-22: 1 via TOPICAL
  Filled 2013-06-22: qty 56

## 2013-06-22 MED ORDER — FERROUS SULFATE 325 (65 FE) MG PO TABS
325.0000 mg | ORAL_TABLET | Freq: Two times a day (BID) | ORAL | Status: DC
  Administered 2013-06-22: 325 mg via ORAL
  Filled 2013-06-22: qty 1

## 2013-06-22 MED ORDER — METHYLERGONOVINE MALEATE 0.2 MG/ML IJ SOLN: 0.2000 mg | INTRAMUSCULAR | Status: DC | PRN

## 2013-06-22 MED ORDER — ONDANSETRON HCL 4 MG/2ML IJ SOLN: 4.0000 mg | Freq: Four times a day (QID) | INTRAMUSCULAR | Status: DC | PRN

## 2013-06-22 MED ORDER — LACTATED RINGERS IV SOLN: 500.0000 mL | INTRAVENOUS | Status: DC | PRN

## 2013-06-22 MED ORDER — OXYTOCIN 40 UNITS IN LACTATED RINGERS INFUSION - SIMPLE MED
62.5000 mL/h | INTRAVENOUS | Status: DC
  Administered 2013-06-22: 62.5 mL/h via INTRAVENOUS
  Filled 2013-06-22: qty 1000

## 2013-06-22 MED ORDER — CITRIC ACID-SODIUM CITRATE 334-500 MG/5ML PO SOLN: 30.0000 mL | ORAL | Status: DC | PRN

## 2013-06-22 MED ORDER — PENICILLIN G POTASSIUM 5000000 UNITS IJ SOLR: 2.5000 10*6.[IU] | INTRAMUSCULAR | Status: DC

## 2013-06-22 MED ORDER — LACTATED RINGERS IV SOLN: INTRAVENOUS | Status: DC

## 2013-06-22 MED ORDER — PRENATAL MULTIVITAMIN CH: 1.0000 | ORAL_TABLET | Freq: Every day | ORAL | Status: DC

## 2013-06-22 MED ORDER — OXYTOCIN BOLUS FROM INFUSION
500.0000 mL | INTRAVENOUS | Status: DC
  Administered 2013-06-22: 500 mL via INTRAVENOUS

## 2013-06-22 MED ORDER — IBUPROFEN 600 MG PO TABS
600.0000 mg | ORAL_TABLET | Freq: Four times a day (QID) | ORAL | Status: DC | PRN
  Administered 2013-06-22: 600 mg via ORAL
  Filled 2013-06-22: qty 1

## 2013-06-22 MED ORDER — LORAZEPAM 1 MG PO TABS: 0.5000 mg | ORAL_TABLET | Freq: Three times a day (TID) | ORAL | Status: DC | PRN

## 2013-06-22 MED ORDER — DIPHENHYDRAMINE HCL 25 MG PO CAPS: 25.0000 mg | ORAL_CAPSULE | Freq: Four times a day (QID) | ORAL | Status: DC | PRN

## 2013-06-22 MED ORDER — PROPRANOLOL HCL 10 MG PO TABS
10.0000 mg | ORAL_TABLET | Freq: Two times a day (BID) | ORAL | Status: DC
  Administered 2013-06-22 (×2): 10 mg via ORAL
  Filled 2013-06-22 (×3): qty 1

## 2013-06-22 MED ORDER — ERYTHROMYCIN 5 MG/GM OP OINT: TOPICAL_OINTMENT | Freq: Once | OPHTHALMIC | Status: DC

## 2013-06-22 MED ORDER — SENNOSIDES-DOCUSATE SODIUM 8.6-50 MG PO TABS
2.0000 | ORAL_TABLET | ORAL | Status: DC
  Administered 2013-06-23: 2 via ORAL
  Filled 2013-06-22: qty 2

## 2013-06-22 MED ORDER — MAGNESIUM HYDROXIDE 400 MG/5ML PO SUSP: 30.0000 mL | ORAL | Status: DC | PRN

## 2013-06-22 MED ORDER — LIDOCAINE HCL (PF) 1 % IJ SOLN
30.0000 mL | INTRAMUSCULAR | Status: DC | PRN
  Administered 2013-06-22: 30 mL via SUBCUTANEOUS
  Filled 2013-06-22: qty 30

## 2013-06-22 MED ORDER — MISOPROSTOL 200 MCG PO TABS
ORAL_TABLET | ORAL | Status: AC
  Filled 2013-06-22: qty 5

## 2013-06-22 MED ORDER — OXYCODONE-ACETAMINOPHEN 5-325 MG PO TABS: 1.0000 | ORAL_TABLET | ORAL | Status: DC | PRN

## 2013-06-22 MED ORDER — FAMOTIDINE 20 MG PO TABS
20.0000 mg | ORAL_TABLET | Freq: Two times a day (BID) | ORAL | Status: DC
  Administered 2013-06-22: 20 mg via ORAL
  Filled 2013-06-22: qty 1

## 2013-06-22 MED ORDER — ZOLPIDEM TARTRATE 5 MG PO TABS: 5.0000 mg | ORAL_TABLET | Freq: Every evening | ORAL | Status: DC | PRN

## 2013-06-22 MED ORDER — DIBUCAINE 1 % RE OINT: 1.0000 "application " | TOPICAL_OINTMENT | RECTAL | Status: DC | PRN

## 2013-06-22 MED ORDER — LANOLIN HYDROUS EX OINT: 1.0000 "application " | TOPICAL_OINTMENT | CUTANEOUS | Status: DC | PRN

## 2013-06-22 MED ORDER — FENTANYL CITRATE 0.05 MG/ML IJ SOLN
50.0000 ug | Freq: Once | INTRAMUSCULAR | Status: AC
  Administered 2013-06-22: 50 ug via INTRAVENOUS
  Filled 2013-06-22: qty 2

## 2013-06-22 MED ORDER — MISOPROSTOL 200 MCG PO TABS
1000.0000 ug | ORAL_TABLET | Freq: Once | ORAL | Status: AC
  Administered 2013-06-22: 1000 ug via RECTAL
  Filled 2013-06-22: qty 5

## 2013-06-22 MED ORDER — ONDANSETRON HCL 4 MG/2ML IJ SOLN: 4.0000 mg | INTRAMUSCULAR | Status: DC | PRN

## 2013-06-22 MED ORDER — SODIUM CHLORIDE 0.9 % IV SOLN
2.0000 g | Freq: Once | INTRAVENOUS | Status: AC
  Administered 2013-06-22: 2 g via INTRAVENOUS
  Filled 2013-06-22: qty 2000

## 2013-06-22 MED ORDER — CALCIUM CARBONATE ANTACID 500 MG PO CHEW
400.0000 mg | CHEWABLE_TABLET | ORAL | Status: DC | PRN
  Administered 2013-06-23: 400 mg via ORAL
  Filled 2013-06-22: qty 1

## 2013-06-22 NOTE — H&P (Signed)
Tye SavoyJennifer B Sermon is a 28 y.o. female presenting for active labor.  GBS +. She reports LOF ~ 6am, and feeling urge to push on arrival. History OB History   Grav Para Term Preterm Abortions TAB SAB Ect Mult Living   2 2 2  0 0 0 0 0 0 2     Past Medical History  Diagnosis Date  . Anxiety     at younger age. was on lexapro  . Infection     bladder, yeast, uri.  . Asthma     childhood  . Abnormal Pap smear     repeat WNL  . History of Bell's palsy   . Migraine    Past Surgical History  Procedure Laterality Date  . Wisdom tooth extraction    . Tooth extraction     Family History: family history is not on file. She was adopted. Social History:  reports that she quit smoking about 8 years ago. Her smoking use included Cigarettes. She smoked 0.00 packs per day. She has never used smokeless tobacco. She reports that she does not drink alcohol or use illicit drugs.   Prenatal Transfer Tool  Maternal Diabetes: No Genetic Screening: Normal Maternal Ultrasounds/Referrals: Normal Fetal Ultrasounds or other Referrals:  None Maternal Substance Abuse:  No Significant Maternal Medications:  Meds include: Other:  Significant Maternal Lab Results:  None Other Comments:  Propranolol 10mg  BID, Ativan 0.5mg    Review of Systems  Constitutional: Negative for fever and chills.   Dilation: 10 Effacement (%): 90 Station: +2 Exam by:: Yisroel Mullendore, cNM Blood pressure 106/70, pulse 76, temperature 99.5 F (37.5 C), temperature source Oral, resp. rate 20, last menstrual period 09/23/2012, SpO2 98.00%, unknown if currently breastfeeding. Exam Physical Exam  Constitutional: She appears well-developed.  Cardiovascular: Normal rate and regular rhythm.   Respiratory: Effort normal and breath sounds normal.    Prenatal labs: ABO, Rh: --/--/A POS, A POS (02/25 0715) Antibody: NEG (02/25 0715) Rubella: 2.11 (08/07 1132) RPR: NON REAC (12/11 1134)  HBsAg: NEGATIVE (08/07 1132)  HIV: NON REACTIVE  (12/11 1134)  GBS: Positive (01/29 0000)  Gluc: wnl  Assessment/Plan: Tye SavoyJennifer B Alles is a 28 y.o. G2P2002 at 3762w6d by LMP here for active labor #Labor:expectant management #Pain: Fentanyl #FWB: Cat 1  #ID:  GBS +; Ampicillin given due to precipitous delivery expected #MOF: Breast #MOC:unknown #Circ:  NA - Female    [name], James 06/22/2013, 11:02 AM  I was present for the exam and agree with above.  [place] Bryttani Blew, CNM 06/27/2013 8:20 AM

## 2013-06-22 NOTE — Lactation Note (Signed)
This note was copied from the chart of Joan Orozco. Lactation Consultation Note  Patient Name: Joan Gaspar BiddingJennifer Delmont Today's Date: 06/22/2013 Reason for consult: Initial assessment of this second-time mother and her newborn, now 1813 hours of age. Mom is still breastfeeding her 4221 month old toddler and is aware of need to offer newborn breast first but can tandem nurse both children ad lib.  Newborn has been feeding frequently for 10-15 minutes and has already had first void and first stool.  Mom denies any latching problems and knows how to hand express colostrum/milk as needed. LC discussed and encouraged benefits of frequent STS and cue feedings. LC encouraged review of Baby and Me pp 9, 14 and 20-25 for STS and BF information. LC provided [employer] Resource brochure and reviewed [facility] services and list of community and web site resources.      Maternal Data Formula Feeding for Exclusion: No Infant to breast within first hour of birth: Yes (LATCH score=10 and baby nursed for 45 minutes) Has patient been taught Hand Expression?: Yes (mom is experienced and states she knows how to hand express) Does the patient have breastfeeding experience prior to this delivery?: Yes  Feeding Feeding Type: Breast Fed Length of feed: 15 min  LATCH Score/Interventions Latch: Grasps breast easily, tongue down, lips flanged, rhythmical sucking.  Audible Swallowing: Spontaneous and intermittent  Type of Nipple: Everted at rest and after stimulation  Comfort (Breast/Nipple): Soft / non-tender     Hold (Positioning): No assistance needed to correctly position infant at breast.  LATCH Score: 10 (previous assessment by RN)  Lactation Tools Discussed/Used   STS, cue feedings, hand expression and nipple care  Consult Status Consult Status: Follow-up Date: 06/23/13 Follow-up type: In-patient    Warrick ParisianBryant, Sani Loiseau [facility] 06/22/2013, 9:22 PM

## 2013-06-22 NOTE — MAU Note (Signed)
PT ARRIVED - UNCOMFORTABLE-   SR[facility] AT 0600-  [facility].   [id number].   POSTIVE GBS.    4 CM  AT [facility]  LAST WEEK.

## 2013-06-23 ENCOUNTER — Encounter: Payer: Medicaid Other | Admitting: Family Medicine

## 2013-06-23 LAB — CBC
HCT: 23 % — ABNORMAL LOW (ref 36.0–46.0)
HCT: 22.8 % — ABNORMAL LOW (ref 36.0–46.0)
Hemoglobin: 7.4 g/dL — ABNORMAL LOW (ref 12.0–15.0)
Hemoglobin: 7.1 g/dL — ABNORMAL LOW (ref 12.0–15.0)
MCH: 25.2 pg — AB (ref 26.0–34.0)
MCH: 24.6 pg — ABNORMAL LOW (ref 26.0–34.0)
MCHC: 32.2 g/dL (ref 30.0–36.0)
MCHC: 31.1 g/dL (ref 30.0–36.0)
MCV: 78.2 fL (ref 78.0–100.0)
MCV: 78.9 fL (ref 78.0–100.0)
PLATELETS: 195 10*3/uL (ref 150–400)
Platelets: 190 10*3/uL (ref 150–400)
RBC: 2.94 MIL/uL — ABNORMAL LOW (ref 3.87–5.11)
RBC: 2.89 MIL/uL — ABNORMAL LOW (ref 3.87–5.11)
RDW: 15.2 % (ref 11.5–15.5)
RDW: 15.1 % (ref 11.5–15.5)
WBC: 12.8 10*3/uL — ABNORMAL HIGH (ref 4.0–10.5)
WBC: 10.8 10*3/uL — AB (ref 4.0–10.5)

## 2013-06-23 MED ORDER — IBUPROFEN 600 MG PO TABS
600.0000 mg | ORAL_TABLET | Freq: Four times a day (QID) | ORAL | Status: DC
Start: 2013-06-23 — End: 2013-06-23

## 2013-06-23 MED ORDER — ONDANSETRON HCL 4 MG PO TABS: 4.0000 mg | ORAL_TABLET | ORAL | Status: DC | PRN

## 2013-06-23 MED ORDER — LANOLIN HYDROUS EX OINT
TOPICAL_OINTMENT | CUTANEOUS | Status: DC | PRN
Start: 2013-06-23 — End: 2013-06-23

## 2013-06-23 MED ORDER — SIMETHICONE 80 MG PO CHEW: 80.0000 mg | CHEWABLE_TABLET | ORAL | Status: DC | PRN

## 2013-06-23 MED ORDER — OXYCODONE-ACETAMINOPHEN 5-325 MG PO TABS: 1.0000 | ORAL_TABLET | ORAL | Status: DC | PRN

## 2013-06-23 MED ORDER — IBUPROFEN 600 MG PO TABS
600.0000 mg | ORAL_TABLET | Freq: Four times a day (QID) | ORAL | Status: DC
  Administered 2013-06-23 (×2): 600 mg via ORAL
  Filled 2013-06-23: qty 1

## 2013-06-23 MED ORDER — DIBUCAINE 1 % RE OINT: 1.0000 "application " | TOPICAL_OINTMENT | RECTAL | Status: DC | PRN

## 2013-06-23 MED ORDER — DIPHENHYDRAMINE HCL 25 MG PO CAPS: 25.0000 mg | ORAL_CAPSULE | Freq: Four times a day (QID) | ORAL | Status: DC | PRN

## 2013-06-23 MED ORDER — ONDANSETRON HCL 4 MG/2ML IJ SOLN: 4.0000 mg | INTRAMUSCULAR | Status: DC | PRN

## 2013-06-23 MED ORDER — BENZOCAINE-MENTHOL 20-0.5 % EX AERO: 1.0000 "application " | INHALATION_SPRAY | CUTANEOUS | Status: DC | PRN

## 2013-06-23 MED ORDER — ZOLPIDEM TARTRATE 5 MG PO TABS: 5.0000 mg | ORAL_TABLET | Freq: Every evening | ORAL | Status: DC | PRN

## 2013-06-23 MED ORDER — ZOLPIDEM TARTRATE 5 MG PO TABS
5.0000 mg | ORAL_TABLET | Freq: Every evening | ORAL | Status: DC | PRN
Start: 2013-06-23 — End: 2013-06-23

## 2013-06-23 MED ORDER — SENNOSIDES-DOCUSATE SODIUM 8.6-50 MG PO TABS: 2.0000 | ORAL_TABLET | ORAL | Status: DC

## 2013-06-23 MED ORDER — WITCH HAZEL-GLYCERIN EX PADS: 1.0000 "application " | MEDICATED_PAD | CUTANEOUS | Status: DC | PRN

## 2013-06-23 MED ORDER — DOCUSATE SODIUM 100 MG PO CAPS: 100.0000 mg | ORAL_CAPSULE | Freq: Two times a day (BID) | ORAL | Status: DC

## 2013-06-23 MED ORDER — TETANUS-DIPHTH-ACELL PERTUSSIS 5-2.5-18.5 LF-MCG/0.5 IM SUSP: 0.5000 mL | Freq: Once | INTRAMUSCULAR | Status: DC

## 2013-06-23 MED ORDER — LANOLIN HYDROUS EX OINT: TOPICAL_OINTMENT | CUTANEOUS | Status: DC | PRN

## 2013-06-23 MED ORDER — PRENATAL MULTIVITAMIN CH: 1.0000 | ORAL_TABLET | Freq: Every day | ORAL | Status: DC

## 2013-06-23 MED ORDER — PRENATAL MULTIVITAMIN CH
1.0000 | ORAL_TABLET | Freq: Every day | ORAL | Status: DC
  Administered 2013-06-23: 1 via ORAL
  Filled 2013-06-23: qty 1

## 2013-06-23 MED ORDER — IBUPROFEN 600 MG PO TABS
600.0000 mg | ORAL_TABLET | Freq: Four times a day (QID) | ORAL | Status: DC
Start: 2013-06-23 — End: 2013-11-09

## 2013-06-23 MED ORDER — CALCIUM CARBONATE ANTACID 500 MG PO CHEW: 400.0000 mg | CHEWABLE_TABLET | Freq: Two times a day (BID) | ORAL | Status: DC | PRN

## 2013-06-23 NOTE — Discharge Summary (Signed)
Obstetric Discharge Summary Reason for Admission: onset of labor Prenatal Procedures: none Intrapartum Procedures: spontaneous vaginal delivery Postpartum Procedures: none Complications-Operative and Postpartum: 2nd degree perineal laceration and hemorrhage Hemoglobin  Date Value Ref Range Status  06/23/2013 7.4* 12.0 - 15.0 g/dL Final     REPEATED TO VERIFY     DELTA CHECK NOTED     HCT  Date Value Ref Range Status  06/23/2013 23.0* 36.0 - 46.0 % Final   Discharge Diagnoses: Term Pregnancy-delivered  Hospital Course:  Joan Orozco is a 28 y.o. [id number] who presented with precipitous delivery of active term labor. She is recovering well. Her hemoglobin dropped to 7.4, but she remained asymptomatic. Will recheck hemoglobin this afternoon and discharge if stable. Will use paragaurd for contracepetion and is breast feeding.  At 7:30 AM a viable and healthy female was delivered via Vaginal, Precipitous Spontaneous Delivery (Presentation: Left Occiput Anterior, compound arm). APGAR: 9, 9; weight pending.  Placenta status: spontaneous, intact. Cord: 3 vessels with the following complications: None. Cord pH: NA  Moderate gush of blood after delivery and w/ placenta, but firm w/ massage. Small amount of bleeding to follow. Will CYT closely.   Anesthesia: None  Episiotomy: None  Lacerations: 2nd degree  Suture Repair: 3.0 vicryl rapide  Est. Blood Loss (mL): 500  Mom to postpartum. Baby to Couplet care / Skin to Skin.  Breastfeeding  Physical Exam:  General: alert, cooperative and no distress  Lochia: appropriate  Uterine Fundus: firm  Incision: N/A  DVT Evaluation: No evidence of DVT seen on physical exam.  Negative Homan's sign.  No significant calf/ankle edema.  Discharge Information: Date: 06/23/2013 Activity: pelvic rest Diet: routine Medications: Ibuprofen and prenatal vitamins Condition: stable Instructions: refer to practice specific booklet Discharge to:  home  Newborn Data: Live born female  Birth Weight: 8 lb 11.9 oz (3966 g) APGAR: 9, 9  Home with mother.  [name], James 06/23/2013, 7:25 AM  I spoke with and examined patient and agree with resident's note and plan of care.  Tawana ScaleMichael Ryan Deadra Diggins, MD OB Fellow 06/23/2013 11:00 AM

## 2013-06-23 NOTE — Lactation Note (Signed)
This note was copied from the chart of Joan Gaspar BiddingJennifer Brisco. Lactation Consultation Note: Experienced BF mom reports that baby has been nursing well. LS 8- 10 by RN. No questions at present. To call prn.  Patient Name: Joan Gaspar BiddingJennifer Orozco UEAVW'UToday's Date: 06/23/2013 Reason for consult: Follow-up assessment   Maternal Data    Feeding   LATCH Score/Interventions    Lactation Tools Discussed/Used     Consult Status Consult Status: Complete    Pamelia HoitWeeks, Gwendolynn Merkey D 06/23/2013, 2:53 PM

## 2013-06-24 ENCOUNTER — Encounter: Payer: Medicaid Other | Admitting: Nurse Practitioner

## 2013-06-24 LAB — TYPE AND SCREEN
ABO/RH(D): A POS
ANTIBODY SCREEN: NEGATIVE
UNIT DIVISION: 0
Unit division: 0

## 2013-06-24 NOTE — Progress Notes (Signed)
Post discharge chart review completed.  

## 2013-06-29 NOTE — H&P (Signed)
Attestation of Attending Supervision of Advanced Practitioner (PA/CNM/NP): Evaluation and management procedures were performed by the Advanced Practitioner under my supervision and collaboration.  I have reviewed the Advanced Practitioner's note and chart, and I agree with the management and plan.  Reva BoresPRATT,Shruti Arrey S, MD [facility] Attending 06/29/2013 11:07 AM

## 2013-07-26 ENCOUNTER — Ambulatory Visit (INDEPENDENT_AMBULATORY_CARE_PROVIDER_SITE_OTHER): Payer: Medicaid Other | Admitting: Family Medicine

## 2013-07-26 ENCOUNTER — Encounter: Payer: Self-pay | Admitting: Family Medicine

## 2013-07-26 DIAGNOSIS — F411 Generalized anxiety disorder: Secondary | ICD-10-CM

## 2013-07-26 NOTE — Patient Instructions (Signed)
Generalized Anxiety Disorder Generalized anxiety disorder (GAD) is a mental disorder. It interferes with life functions, including relationships, work, and school. GAD is different from normal anxiety, which everyone experiences at some point in their lives in response to specific life events and activities. Normal anxiety actually helps us prepare for and get through these life events and activities. Normal anxiety goes away after the event or activity is over.  GAD causes anxiety that is not necessarily related to specific events or activities. It also causes excess anxiety in proportion to specific events or activities. The anxiety associated with GAD is also difficult to control. GAD can vary from mild to severe. People with severe GAD can have intense waves of anxiety with physical symptoms (panic attacks).  SYMPTOMS The anxiety and worry associated with GAD are difficult to control. This anxiety and worry are related to many life events and activities and also occur more days than not for 6 months or longer. People with GAD also have three or more of the following symptoms (one or more in children):  Restlessness.   Fatigue.  Difficulty concentrating.   Irritability.  Muscle tension.  Difficulty sleeping or unsatisfying sleep. DIAGNOSIS GAD is diagnosed through an assessment by your caregiver. Your caregiver will ask you questions aboutyour mood,physical symptoms, and events in your life. Your caregiver may ask you about your medical history and use of alcohol or drugs, including prescription medications. Your caregiver may also do a physical exam and blood tests. Certain medical conditions and the use of certain substances can cause symptoms similar to those associated with GAD. Your caregiver may refer you to a mental health specialist for further evaluation. TREATMENT The following therapies are usually used to treat GAD:   Medication Antidepressant medication usually is  prescribed for long-term daily control. Antianxiety medications may be added in severe cases, especially when panic attacks occur.   Talk therapy (psychotherapy) Certain types of talk therapy can be helpful in treating GAD by providing support, education, and guidance. A form of talk therapy called cognitive behavioral therapy can teach you healthy ways to think about and react to daily life events and activities.  Stress managementtechniques These include yoga, meditation, and exercise and can be very helpful when they are practiced regularly. A mental health specialist can help determine which treatment is best for you. Some people see improvement with one therapy. However, other people require a combination of therapies. Document Released: 08/09/2012 Document Reviewed: 08/09/2012 [facility] Patient Information 2014 [place], [place]. Intrauterine Device Information An intrauterine device (IUD) is inserted into your uterus to prevent pregnancy. There are two types of IUDs available:   Copper IUD This type of IUD is wrapped in copper wire and is placed inside the uterus. Copper makes the uterus and fallopian tubes produce a fluid that kills sperm. The copper IUD can stay in place for 10 years.  Hormone IUD This type of IUD contains the hormone progestin (synthetic progesterone). The hormone thickens the cervical mucus and prevents sperm from entering the uterus. It also thins the uterine lining to prevent implantation of a fertilized egg. The hormone can weaken or kill the sperm that get into the uterus. One type of hormone IUD can stay in place for 5 years, and another type can stay in place for 3 years. Your health care provider will make sure you are a good candidate for a contraceptive IUD. Discuss with your health care provider the possible side effects.  ADVANTAGES OF AN INTRAUTERINE DEVICE  IUDs  are highly effective, reversible, long acting, and low maintenance.   There are no  estrogen-related side effects.   An IUD can be used when breastfeeding.   IUDs are not associated with weight gain.   The copper IUD works immediately after insertion.   The hormone IUD works right away if inserted within 7 days of your period starting. You will need to use a backup method of birth control for 7 days if the hormone IUD is inserted at any other time in your cycle.  The copper IUD does not interfere with your female hormones.   The hormone IUD can make heavy menstrual periods lighter and decrease cramping.   The hormone IUD can be used for 3 or 5 years.   The copper IUD can be used for 10 years. DISADVANTAGES OF AN INTRAUTERINE DEVICE  The hormone IUD can be associated with irregular bleeding patterns.   The copper IUD can make your menstrual flow heavier and more painful.   You may experience cramping and vaginal bleeding after insertion.  Document Released: 03/18/2004 Document Revised: 12/15/2012 Document Reviewed: 10/03/2012 [facility] Patient Information 2014 [place], [place].

## 2013-07-26 NOTE — Progress Notes (Signed)
  Subjective:     Joan Orozco is a 28 y.o. female who presents for a postpartum visit. She is 5 weeks postpartum following a spontaneous vaginal delivery. I have fully reviewed the prenatal and intrapartum course. The delivery was at 39 gestational weeks. Outcome: spontaneous vaginal delivery. Anesthesia: none. Postpartum course has been uncomplicated. Baby's course has been unremarkable. Baby is feeding by breast. Bleeding staining only. Bowel function is normal. Bladder function is normal. Patient is not sexually active. Contraception method is IUD. Postpartum depression screening: borderline--also, she has real anxiety playing a more profound role.  Almost borders on social anxiety, with an unrealistic fear of leaving home.  There are too many dangers in the world.  She is afraid of someone stealing her kids and being attacked personally.  The following portions of the patient's history were reviewed and updated as appropriate: allergies, current medications, past family history, past medical history, past social history, past surgical history and problem list.  Review of Systems A comprehensive review of systems was negative.   Objective:    BP 115/52  Pulse 52  Ht 5\' 3"  (1.6 m)  Wt 148 lb (67.132 kg)  BMI 26.22 kg/m2  Breastfeeding? Yes  General:  alert, cooperative and appears stated age   Vulva:  normal  Vagina: normal vagina, no discharge, exudate, lesion, or erythema  Cervix:  no cervical motion tenderness and no lesions  Corpus: normal size, contour, position, consistency, mobility, non-tender  Adnexa:  normal adnexa        Assessment:     Normal postpartum exam. Pap smear not done at today's visit.   Plan:    1. Contraception: IUD 2. Anxiety--needs to seek professional help.--#'s given. 3. Follow up in: 2 weeks or as needed.

## 2013-08-09 ENCOUNTER — Ambulatory Visit (INDEPENDENT_AMBULATORY_CARE_PROVIDER_SITE_OTHER): Payer: Medicaid Other | Admitting: Obstetrics & Gynecology

## 2013-08-09 ENCOUNTER — Encounter: Payer: Self-pay | Admitting: Obstetrics & Gynecology

## 2013-08-09 VITALS — BP 104/48 | HR 53 | Ht 63.0 in | Wt 144.0 lb

## 2013-08-09 DIAGNOSIS — Z30430 Encounter for insertion of intrauterine contraceptive device: Secondary | ICD-10-CM

## 2013-08-09 DIAGNOSIS — Z01812 Encounter for preprocedural laboratory examination: Secondary | ICD-10-CM

## 2013-08-09 LAB — POCT URINE PREGNANCY: Preg Test, Ur: NEGATIVE

## 2013-08-09 NOTE — Progress Notes (Signed)
   Subjective:    Patient ID: Joan Orozco, female    DOB: 08/12/1985, 28 y.o.   MRN: [id number]  HPI  Victorino DikeJennifer is here today for insertion of Paragard IUD. All questions were answered.  Review of Systems     Objective:   Physical Exam  UPT negative, consent signed, Time out procedure done. Cervix prepped with betadine and grasped with a single tooth tenaculum. Paragard was easily placed and the strings were cut to 3-4 cm. Uterus sounded to 9 cm. She tolerated the procedure well.         Assessment & Plan:  Contraception- Paragard RTC 4 weeks for string check

## 2013-09-07 ENCOUNTER — Ambulatory Visit: Payer: Medicaid Other | Admitting: Family Medicine

## 2013-09-15 ENCOUNTER — Ambulatory Visit (INDEPENDENT_AMBULATORY_CARE_PROVIDER_SITE_OTHER): Payer: Self-pay | Admitting: Obstetrics & Gynecology

## 2013-09-15 ENCOUNTER — Encounter: Payer: Self-pay | Admitting: Obstetrics & Gynecology

## 2013-09-15 VITALS — BP 120/73 | HR 62 | Ht 63.0 in | Wt 139.0 lb

## 2013-09-15 DIAGNOSIS — R102 Pelvic and perineal pain: Secondary | ICD-10-CM

## 2013-09-15 DIAGNOSIS — Z30431 Encounter for routine checking of intrauterine contraceptive device: Secondary | ICD-10-CM

## 2013-09-15 DIAGNOSIS — N949 Unspecified condition associated with female genital organs and menstrual cycle: Secondary | ICD-10-CM

## 2013-09-15 MED ORDER — DOXYCYCLINE HYCLATE 100 MG PO CAPS: 100.0000 mg | ORAL_CAPSULE | Freq: Two times a day (BID) | ORAL | Status: DC

## 2013-09-15 MED ORDER — CEPHALEXIN 500 MG PO CAPS: 500.0000 mg | ORAL_CAPSULE | Freq: Four times a day (QID) | ORAL | Status: DC

## 2013-09-15 NOTE — Progress Notes (Signed)
   Subjective:    Patient ID: Joan Orozco, female    DOB: 04/28/1985, 28 y.o.   MRN: [id number]  HPI  Joan Orozco is here a month after having a Paragard IUD placed. She reports that almost every morning and sometimes in the afternoons she will experience pelvic/LBP. IBU 600 mg does help this but she is concerned by taking meds (worried that she will get an ulcer). She says that sex is not painful.  Review of Systems     Objective:   Physical Exam  Strings seen Moderate amount of yellowish vaginal discharge No CMT, but she does seem some uncomfortable with a bimanual exam Bedside u/s confirms correct placement of the Paragard      Assessment & Plan:  Discomfort with Paragard- Keflex and IBU RTC 1 month/prn sooner. If it is no better, then I would be willing to remove it is she desires.

## 2013-09-16 LAB — GC/CHLAMYDIA PROBE AMP
CT PROBE, AMP APTIMA: NEGATIVE
GC PROBE AMP APTIMA: NEGATIVE

## 2013-09-23 ENCOUNTER — Encounter: Payer: Self-pay | Admitting: Obstetrics & Gynecology

## 2013-10-10 ENCOUNTER — Other Ambulatory Visit: Payer: Self-pay | Admitting: Physician Assistant

## 2013-10-10 DIAGNOSIS — R109 Unspecified abdominal pain: Secondary | ICD-10-CM

## 2013-10-14 ENCOUNTER — Ambulatory Visit
Admission: RE | Admit: 2013-10-14 | Discharge: 2013-10-14 | Disposition: A | Discharge status: Home / Self Care | Payer: Self-pay | Source: Ambulatory Visit | Attending: Physician Assistant | Admitting: Physician Assistant

## 2013-10-14 DIAGNOSIS — R109 Unspecified abdominal pain: Secondary | ICD-10-CM

## 2013-10-14 IMAGING — US US ABDOMEN COMPLETE
1 series · 14 of 25 positions shown · non-contrast
Comparison: None.

CLINICAL DATA: Abdominal pain. Right-sided flank pain. Four months
postpartum.

EXAM:
ULTRASOUND ABDOMEN COMPLETE

[Series 1: us abdomen complete · 0.33mm/px · 14 of 90 slices shown]
[im 1/90]
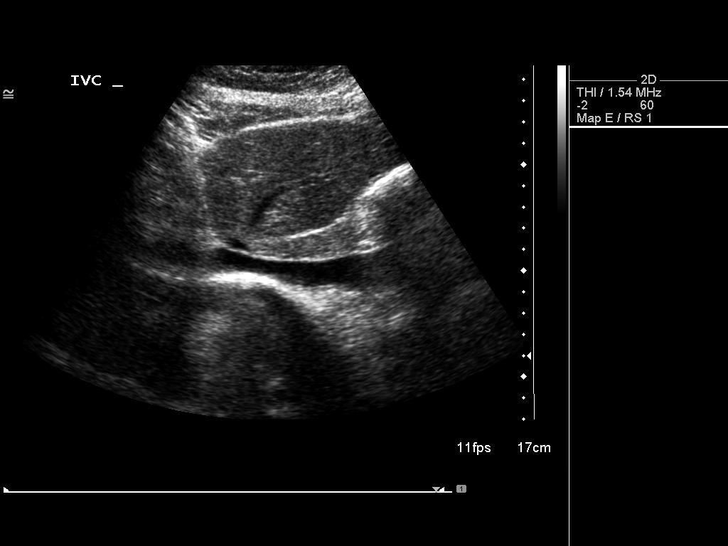
[im 8/90]
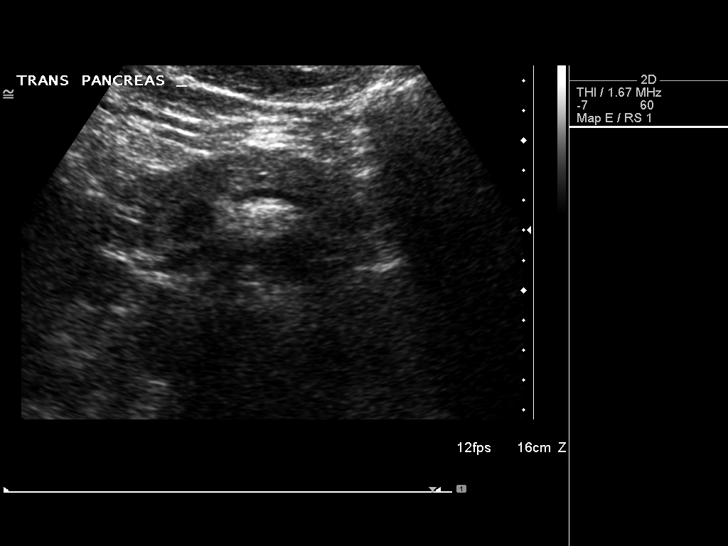
[im 15/90]
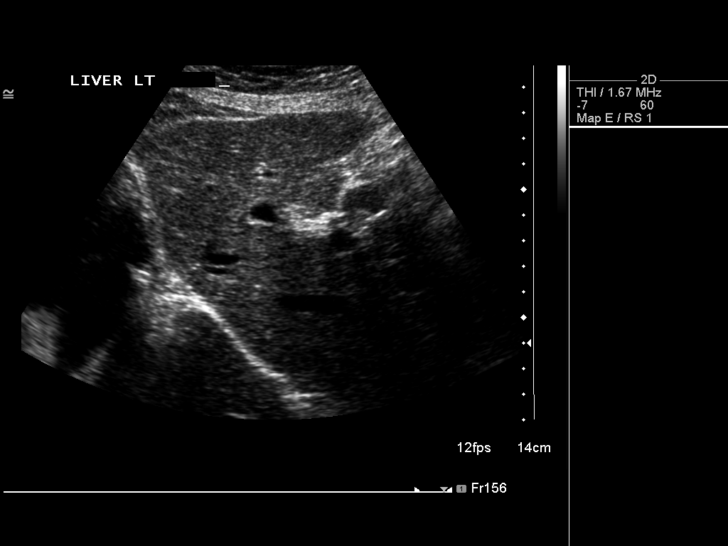
[im 23/90]
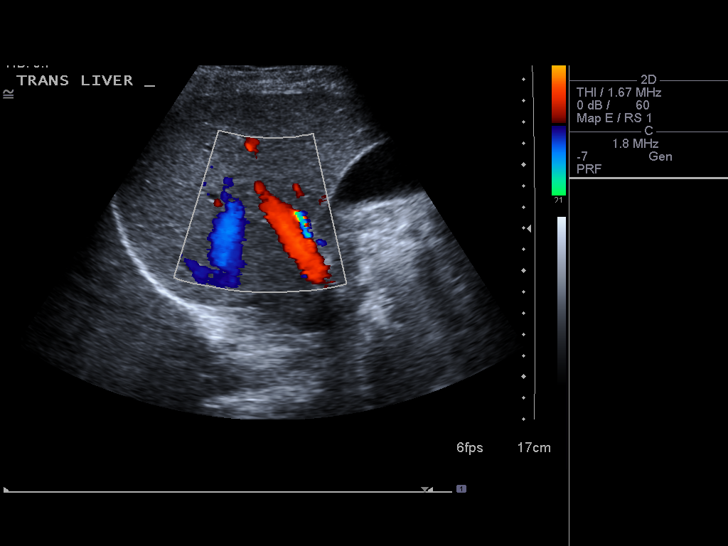
[im 30/90]
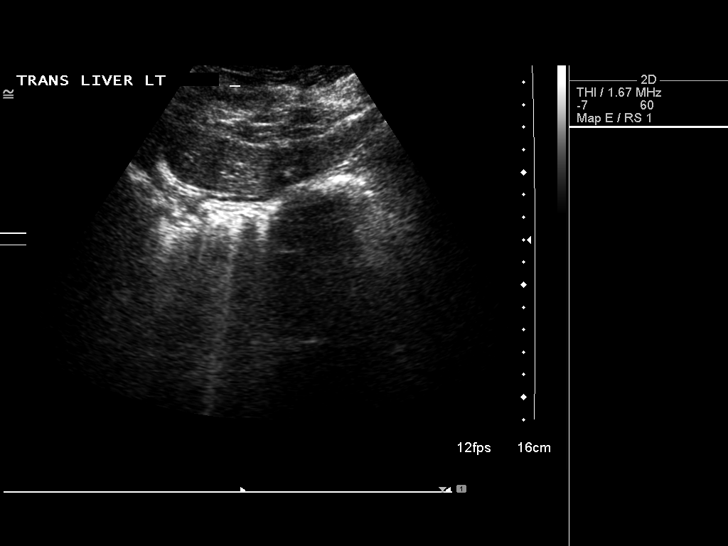
[im 34/90]
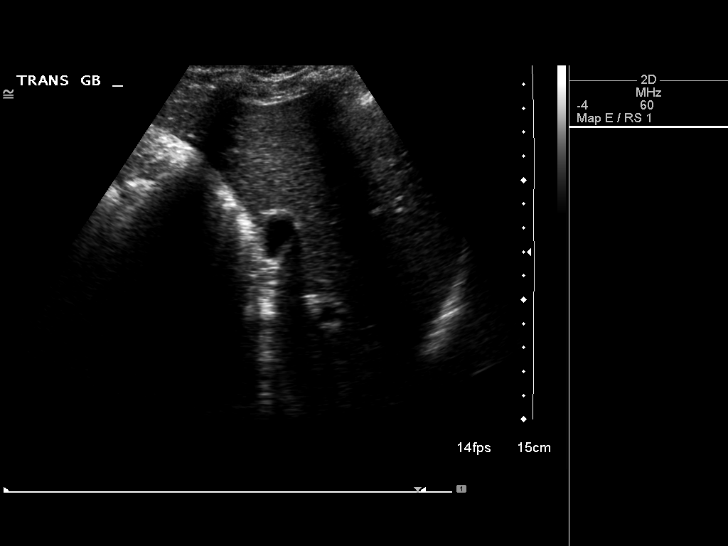
[im 41/90]
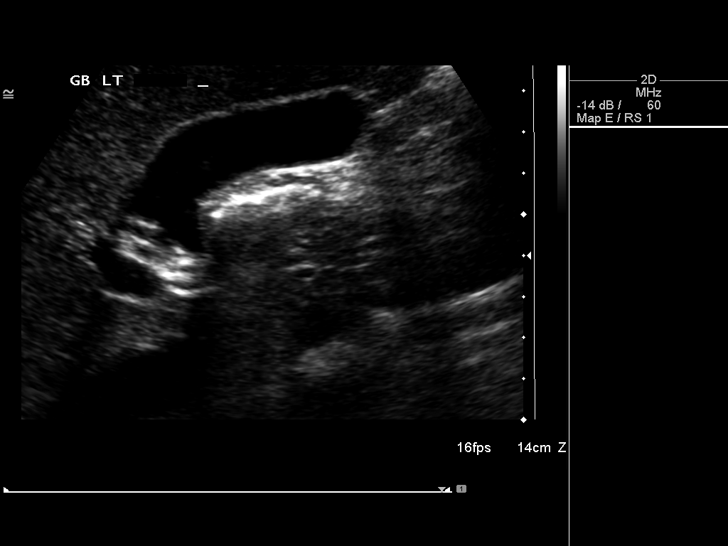
[im 49/90]
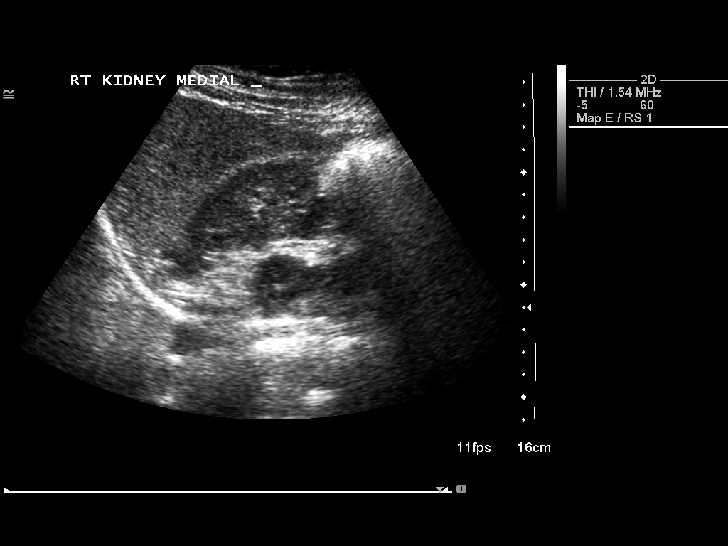
[im 56/90]
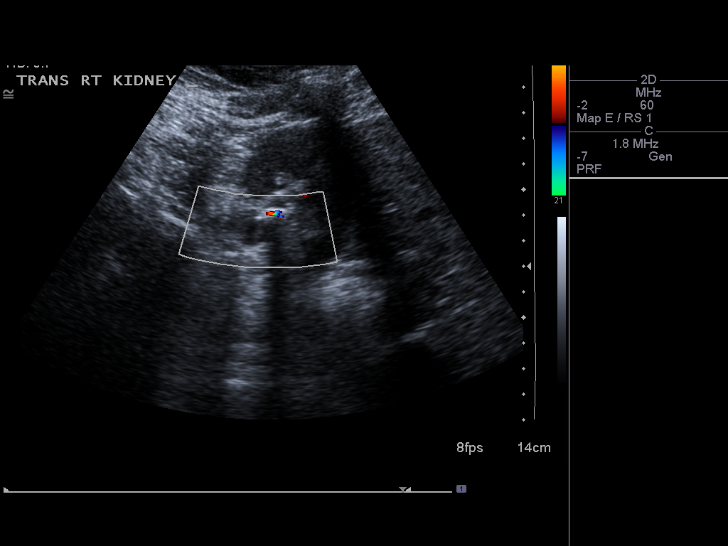
[im 60/90]
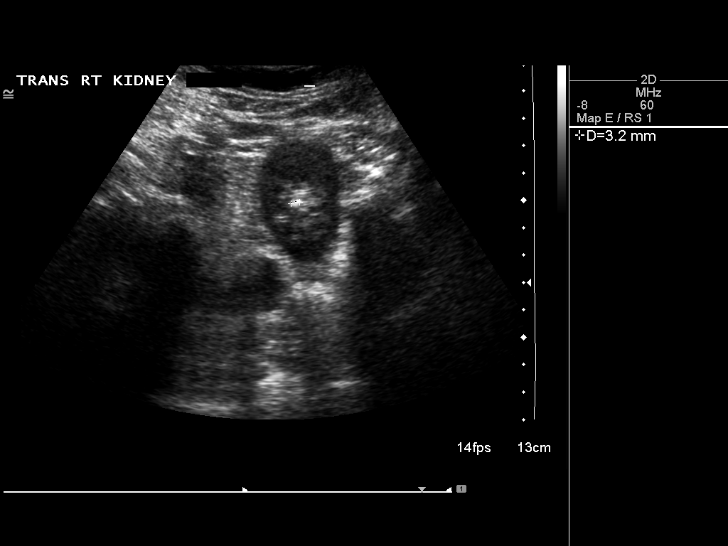
[im 67/90]
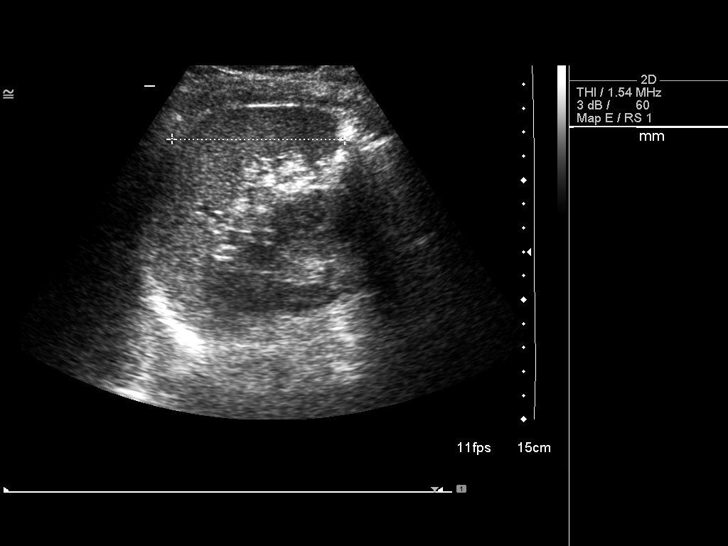
[im 75/90]
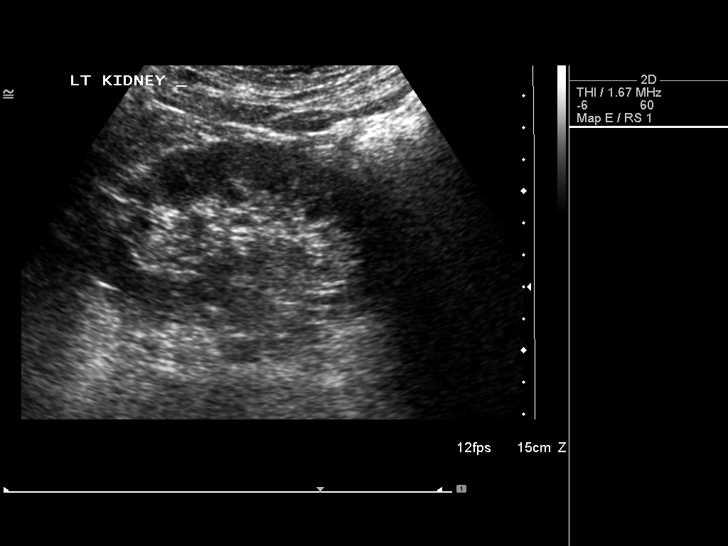
[im 82/90]
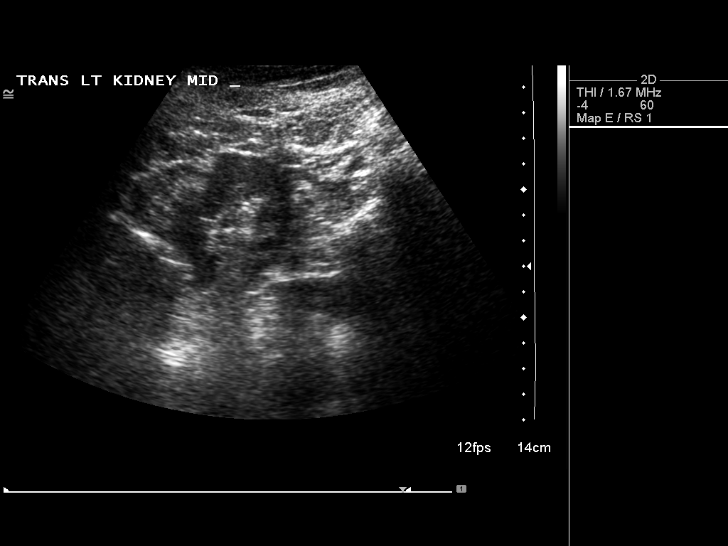
[im 90/90]
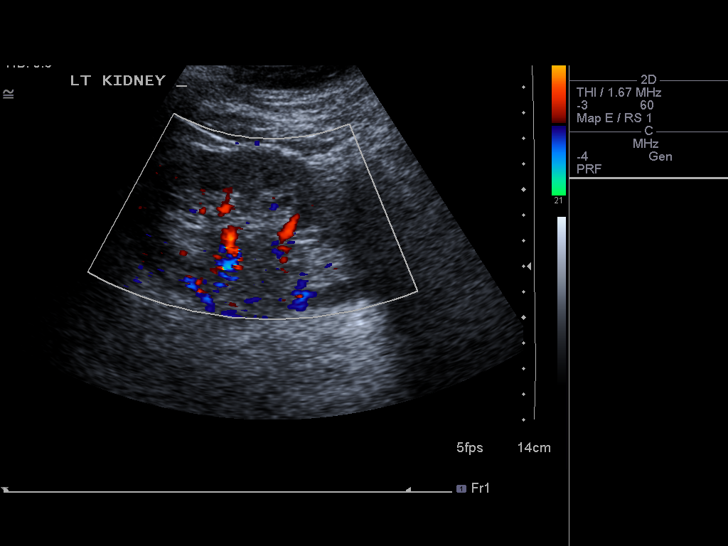

[14 of 25 positions shown; findings below may reference images not displayed]

FINDINGS: Gallbladder:

No gallstones or wall thickening visualized. No sonographic Murphy
sign noted.

Common bile duct:

Diameter: 3 mm

Liver:

No focal lesion identified. Within normal limits in parenchymal
echogenicity.

IVC:

No abnormality visualized.

Pancreas:

Visualized portion unremarkable.

Spleen:

Size and appearance within normal limits.

Right Kidney:

Length: 11.4 cm. Echogenicity within normal limits. At least 2
calculi are noted in the lower pole of the right kidney, largest
measuring 1.3 cm. No mass or hydronephrosis visualized.

Left Kidney:

Length: 10.3 cm. Echogenicity within normal limits. Probable 8 mm
calculus noted in lower pole. No mass or hydronephrosis visualized.

Abdominal aorta:

No aneurysm visualized.

Other findings:

None.
IMPRESSION: No evidence of gallstones or biliary dilatation.

Bilateral nephrolithiasis, largest in right kidney measuring 1.3 cm.
No evidence of hydronephrosis.

## 2013-11-09 ENCOUNTER — Encounter (HOSPITAL_COMMUNITY): Payer: Self-pay | Admitting: Emergency Medicine

## 2013-11-09 ENCOUNTER — Emergency Department (HOSPITAL_COMMUNITY)
Admission: EM | Admit: 2013-11-09 | Discharge: 2013-11-09 | Disposition: A | Discharge status: Home / Self Care | Payer: Self-pay | Attending: Emergency Medicine | Admitting: Emergency Medicine

## 2013-11-09 DIAGNOSIS — R112 Nausea with vomiting, unspecified: Secondary | ICD-10-CM | POA: Insufficient documentation

## 2013-11-09 DIAGNOSIS — Z8619 Personal history of other infectious and parasitic diseases: Secondary | ICD-10-CM | POA: Insufficient documentation

## 2013-11-09 DIAGNOSIS — Z87891 Personal history of nicotine dependence: Secondary | ICD-10-CM | POA: Insufficient documentation

## 2013-11-09 DIAGNOSIS — G43909 Migraine, unspecified, not intractable, without status migrainosus: Secondary | ICD-10-CM | POA: Insufficient documentation

## 2013-11-09 DIAGNOSIS — Z79899 Other long term (current) drug therapy: Secondary | ICD-10-CM | POA: Insufficient documentation

## 2013-11-09 DIAGNOSIS — J45909 Unspecified asthma, uncomplicated: Secondary | ICD-10-CM | POA: Insufficient documentation

## 2013-11-09 DIAGNOSIS — G43809 Other migraine, not intractable, without status migrainosus: Secondary | ICD-10-CM

## 2013-11-09 DIAGNOSIS — F411 Generalized anxiety disorder: Secondary | ICD-10-CM | POA: Insufficient documentation

## 2013-11-09 MED ORDER — PROMETHAZINE HCL 25 MG/ML IJ SOLN
12.5000 mg | Freq: Four times a day (QID) | INTRAMUSCULAR | Status: DC | PRN
  Filled 2013-11-09: qty 1

## 2013-11-09 MED ORDER — ONDANSETRON HCL 4 MG/2ML IJ SOLN: 4.0000 mg | Freq: Once | INTRAMUSCULAR | Status: DC

## 2013-11-09 MED ORDER — PROCHLORPERAZINE EDISYLATE 5 MG/ML IJ SOLN
10.0000 mg | Freq: Four times a day (QID) | INTRAMUSCULAR | Status: DC | PRN
  Administered 2013-11-09: 10 mg via INTRAVENOUS
  Filled 2013-11-09: qty 2

## 2013-11-09 MED ORDER — SODIUM CHLORIDE 0.9 % IV BOLUS (SEPSIS)
1000.0000 mL | Freq: Once | INTRAVENOUS | Status: AC
  Administered 2013-11-09: 1000 mL via INTRAVENOUS

## 2013-11-09 MED ORDER — DIPHENHYDRAMINE HCL 50 MG/ML IJ SOLN
12.5000 mg | Freq: Once | INTRAMUSCULAR | Status: AC
  Administered 2013-11-09: 12.5 mg via INTRAVENOUS
  Filled 2013-11-09: qty 1

## 2013-11-09 MED ORDER — SODIUM CHLORIDE 0.9 % IV BOLUS (SEPSIS)
1000.0000 mL | Freq: Once | INTRAVENOUS | Status: AC
Start: 2013-11-09 — End: 2013-11-09
  Administered 2013-11-09: 1000 mL via INTRAVENOUS

## 2013-11-09 MED ORDER — ONDANSETRON HCL 4 MG/2ML IJ SOLN
4.0000 mg | Freq: Once | INTRAMUSCULAR | Status: AC
  Administered 2013-11-09: 4 mg via INTRAVENOUS
  Filled 2013-11-09: qty 2

## 2013-11-09 NOTE — ED Notes (Signed)
Pt states feeling much better

## 2013-11-09 NOTE — Progress Notes (Signed)
ED CM consulted by Janett Billow concerning patient needing to f/u with a PCP for migraines. Pt noted to be without  a PCP/ or health insurance. Met patient at bedside, confirmed information.  CM discussed with patient  establishing care with a PCP.  Provided information about the [facility], brochure with address and phone number was given. Pt was instructed to walk in during the hours of operation and schedule a follow-up appt. Pt verbalized understanding and appreciation for the assistance. No further CM needs identified.

## 2013-11-09 NOTE — ED Provider Notes (Signed)
Patient signed out to me by Neva SeatGreene, PA-C.  Plan: Finish fluids, reassess.  10:26 PM Patient is feeling much better. She states that her headache is now 2 or 3/10. She denies feeling dizzy anymore. She is not orthostatic. She feels well enough to go home. She will continue Tylenol for pain. She is stable and ready for discharge.  Roxy Horsemanobert Mariah Harn, PA-C 11/09/13 [id number]

## 2013-11-09 NOTE — Discharge Instructions (Signed)
Please follow-up with urology this week.  Ureteral Colic Ureteral colic is spasm-like pain from the kidney or the ureter. This is often caused by a kidney stone. The pain is caused by the stone trying to get through the tubes that pass your pee. HOME CARE   Drink enough fluids to keep your pee (urine) clear or pale yellow.  Strain all your pee. A strainer will be provided. Keep anything caught in the strainer and bring it to your doctor. The stone causing the pain may be very small.  Only take medicine as told by your doctor.  Follow up with your doctor as told. GET HELP RIGHT AWAY IF:   Pain is not controlled with medicine.  Pain continues or gets worse.  The pain changes and there is chest or belly (abdominal) pain.  You pass out (faint).  You cannot pee.  You keep throwing up (vomiting).  You have a temperature by mouth above 102 F (38.9 C), not controlled by medicine. MAKE SURE YOU:   Understand these instructions.  Will watch this condition.  Will get help right away if you are not doing well or get worse. Document Released: 10/01/2007 Document Revised: 07/07/2011 Document Reviewed: 10/01/2007 [facility] Patient Information 2015 [place], [place]. This information is not intended to replace advice given to you by your health care provider. Make sure you discuss any questions you have with your health care provider. Migraine Headache A migraine headache is an intense, throbbing pain on one or both sides of your head. A migraine can last for 30 minutes to several hours. CAUSES  The exact cause of a migraine headache is not always known. However, a migraine may be caused when nerves in the brain become irritated and release chemicals that cause inflammation. This causes pain. Certain things may also trigger migraines, such as:  Alcohol.  Smoking.  Stress.  Menstruation.  Aged cheeses.  Foods or drinks that contain nitrates, glutamate, aspartame, or  tyramine.  Lack of sleep.  Chocolate.  Caffeine.  Hunger.  Physical exertion.  Fatigue.  Medicines used to treat chest pain (nitroglycerine), birth control pills, estrogen, and some blood pressure medicines. SIGNS AND SYMPTOMS  Pain on one or both sides of your head.  Pulsating or throbbing pain.  Severe pain that prevents daily activities.  Pain that is aggravated by any physical activity.  Nausea, vomiting, or both.  Dizziness.  Pain with exposure to bright lights, loud noises, or activity.  General sensitivity to bright lights, loud noises, or smells. Before you get a migraine, you may get warning signs that a migraine is coming (aura). An aura may include:  Seeing flashing lights.  Seeing bright spots, halos, or zig-zag lines.  Having tunnel vision or blurred vision.  Having feelings of numbness or tingling.  Having trouble talking.  Having muscle weakness. DIAGNOSIS  A migraine headache is often diagnosed based on:  Symptoms.  Physical exam.  A CT scan or MRI of your head. These imaging tests cannot diagnose migraines, but they can help rule out other causes of headaches. TREATMENT Medicines may be given for pain and nausea. Medicines can also be given to help prevent recurrent migraines.  HOME CARE INSTRUCTIONS  Only take over-the-counter or prescription medicines for pain or discomfort as directed by your health care provider. The use of long-term narcotics is not recommended.  Lie down in a dark, quiet room when you have a migraine.  Keep a journal to find out what may trigger your migraine headaches.  For example, write down:  What you eat and drink.  How much sleep you get.  Any change to your diet or medicines.  Limit alcohol consumption.  Quit smoking if you smoke.  Get 7-9 hours of sleep, or as recommended by your health care provider.  Limit stress.  Keep lights dim if bright lights bother you and make your migraines  worse. SEEK IMMEDIATE MEDICAL CARE IF:   Your migraine becomes severe.  You have a fever.  You have a stiff neck.  You have vision loss.  You have muscular weakness or loss of muscle control.  You start losing your balance or have trouble walking.  You feel faint or pass out.  You have severe symptoms that are different from your first symptoms. MAKE SURE YOU:   Understand these instructions.  Will watch your condition.  Will get help right away if you are not doing well or get worse. Document Released: 04/14/2005 Document Revised: 02/02/2013 Document Reviewed: 12/20/2012 [facility] Patient Information 2015 [place], [place]. This information is not intended to replace advice given to you by your health care provider. Make sure you discuss any questions you have with your health care provider.

## 2013-11-09 NOTE — ED Notes (Addendum)
Pt reports hx of migraines. Having severe migraines x 3 over the past 3 days. Having dizziness, sensitivity to light and sound and n/v.

## 2013-11-09 NOTE — ED Provider Notes (Signed)
Medical screening examination/treatment/procedure(s) were performed by non-physician practitioner and as supervising physician I was immediately available for consultation/collaboration.   EKG Interpretation None      Devoria AlbeIva Radha Coggins, MD, Armando GangFACEP   Ward GivensIva L Otilia Kareem, MD 11/09/13 [phone]

## 2013-11-09 NOTE — ED Notes (Signed)
Pt tearful states she is unable to do the things she enjoys with her children related to pain and unable to follow up with urologist or neurologist due to finical reasons. Joan Orozco with case management informed.

## 2013-11-09 NOTE — ED Provider Notes (Signed)
CSN: [id number]     Arrival date & time 11/09/13  1541 History   First MD Initiated Contact with Patient 11/09/13 1752     Chief Complaint  Patient presents with  . Migraine     (Consider location/radiation/quality/duration/timing/severity/associated sxs/prior Treatment) HPI  28 yo female presents to ED with a complaint of a migraine.  She has a history of migraines.  She typically has about 1 migraine each month and takes Imitrex at onset.  However, in the past week and a half, she has had 4-5 migraines.  She states that the pain is more severe than normal..  She states that lying flat and pressure on the frontal region of her head are relieving.  She has photophobia and nausea and vomiting with these headaches.  Denies any fever.  Pt currently has a 13 mm kidney stone and began taking Tramadol about 1.5 weeks ago for the pain.  She is currently breastfeeding and concerned about being able to care for her children due to her pain.  Pt is breast feeding her 49 month old baby.   Past Medical History  Diagnosis Date  . Anxiety     at younger age. was on lexapro  . Infection     bladder, yeast, uri.  . Asthma     childhood  . Abnormal Pap smear     repeat WNL  . History of Bell's palsy   . Migraine    Past Surgical History  Procedure Laterality Date  . Wisdom tooth extraction    . Tooth extraction     Family History  Problem Relation Age of Onset  . Adopted: Yes   History  Substance Use Topics  . Smoking status: Former Smoker    Types: Cigarettes    Quit date: 10/15/2004  . Smokeless tobacco: Never Used  . Alcohol Use: No     Comment: social   OB History   Grav Para Term Preterm Abortions TAB SAB Ect Mult Living   2 2 2  0 0 0 0 0 0 2     Review of Systems  General assessment:  woman lying flat in bed with arms covering face from light.  In pain. HEENT:  Normocephalic, atraumatic.  No tenderness to palpation of frontal or maxillary sinuses.  Unable to assess  patient's eyes due to photophobia and refusal to move her arm.  Neck is supple.   Chest:  CTA bilaterally anteriorly and posteriorly.  No wheezes, rales, or crackles.  Heart:  RRR with no m/g/r. Abdomen:  BS+, no tenderness to palpation.   Extremities:  Strong grip strength.  Distal pulses intact.  Psych:  Alert and oriented x 3.       Allergies  Cefdinir  Home Medications   Prior to Admission medications   Medication Sig Start Date End Date Taking? Authorizing Provider  acetaminophen (TYLENOL) 500 MG tablet Take 1,000 mg by mouth every 6 (six) hours as needed for moderate pain. For heartburn   Yes Historical Provider, MD  ibuprofen (ADVIL,MOTRIN) 200 MG tablet Take 400-600 mg by mouth every 6 (six) hours as needed for headache.   Yes Historical Provider, MD  omeprazole (PRILOSEC) 10 MG capsule Take 10 mg by mouth daily as needed.    Yes Historical Provider, MD  Prenatal Vit-Fe Fumarate-FA (PRENATAL MULTIVITAMIN) TABS tablet Take 1 tablet by mouth daily at 12 noon.   Yes Historical Provider, MD  SUMAtriptan (IMITREX) 25 MG tablet Take 12.5 mg by mouth every 2 (two) hours  as needed for migraine or headache. May repeat in 2 hours if headache persists or recurs.   Yes Historical Provider, MD  traMADol (ULTRAM) 50 MG tablet Take 50 mg by mouth every 6 (six) hours as needed for moderate pain.   Yes Historical Provider, MD   BP 104/50  Pulse 76  Temp(Src) 97.6 F (36.4 C) (Oral)  Resp 18  SpO2 100% Physical Exam  Nursing note and vitals reviewed. Constitutional: She is oriented to person, place, and time. She appears well-developed and well-nourished. No distress.  HENT:  Head: Normocephalic and atraumatic.  Eyes: Pupils are equal, round, and reactive to light.  Neck: Normal range of motion. Neck supple. No muscular tenderness present. Normal range of motion present. No Brudzinski's sign and no Kernig's sign noted.  Cardiovascular: Normal rate and regular rhythm.   Pulmonary/Chest:  Effort normal.  Abdominal: Soft.  Neurological: She is alert and oriented to person, place, and time. She has normal strength. No cranial nerve deficit or sensory deficit. She displays a negative Romberg sign. GCS eye subscore is 4. GCS verbal subscore is 5. GCS motor subscore is 6.  Skin: Skin is warm and dry.    ED Course  Procedures (including critical care time) Labs Review Labs Reviewed - No data to display  Imaging Review No results found.   EKG Interpretation None      MDM   Final diagnoses:  None   7: 34 pm Patient does not appear ill but has a severe migraine. Ordered Zofran, Benadryl and Compazine and 1 L saline these are thought to be relatively safe.   At end of shift patient handed over to [facility] Browning PA-C, she will need to be re-evaluated at 8:30 to see if she has had relief of her symptoms. Pt should be ambulated before DC. No emergency symptoms present for headache. Deny SAH, Meningitis, or other acute etiologies.    Dorthula Matasiffany G Charlese Gruetzmacher, PA-C 11/09/13 1935

## 2013-11-16 NOTE — ED Provider Notes (Signed)
Medical screening examination/treatment/procedure(s) were performed by non-physician practitioner and as supervising physician I was immediately available for consultation/collaboration.   EKG Interpretation None        Nahome Bublitz, MD 11/16/13 1026 

## 2013-12-02 ENCOUNTER — Other Ambulatory Visit: Payer: Self-pay | Admitting: Urology

## 2013-12-05 ENCOUNTER — Encounter (HOSPITAL_BASED_OUTPATIENT_CLINIC_OR_DEPARTMENT_OTHER): Payer: Self-pay | Admitting: *Deleted

## 2013-12-05 NOTE — Progress Notes (Signed)
[facility], urine pregnancy on arrival.Instructed Npo solids after Mn-clear liquids till 0630,then Npo.May take pain med,migraine med as needed with water.

## 2013-12-08 ENCOUNTER — Ambulatory Visit (HOSPITAL_BASED_OUTPATIENT_CLINIC_OR_DEPARTMENT_OTHER): Payer: Medicaid Other | Admitting: Anesthesiology

## 2013-12-08 ENCOUNTER — Encounter (HOSPITAL_BASED_OUTPATIENT_CLINIC_OR_DEPARTMENT_OTHER): Payer: Medicaid Other | Admitting: Anesthesiology

## 2013-12-08 ENCOUNTER — Encounter (HOSPITAL_BASED_OUTPATIENT_CLINIC_OR_DEPARTMENT_OTHER): Payer: Self-pay | Admitting: Anesthesiology

## 2013-12-08 ENCOUNTER — Ambulatory Visit (HOSPITAL_BASED_OUTPATIENT_CLINIC_OR_DEPARTMENT_OTHER)
Admission: RE | Admit: 2013-12-08 | Discharge: 2013-12-08 | Disposition: A | Discharge status: Home / Self Care | Payer: Medicaid Other | Source: Ambulatory Visit | Attending: Urology | Admitting: Urology

## 2013-12-08 ENCOUNTER — Encounter (HOSPITAL_BASED_OUTPATIENT_CLINIC_OR_DEPARTMENT_OTHER)
Admission: RE | Disposition: A | Discharge status: Home / Self Care | Payer: Self-pay | Source: Ambulatory Visit | Attending: Urology

## 2013-12-08 DIAGNOSIS — Z888 Allergy status to other drugs, medicaments and biological substances status: Secondary | ICD-10-CM | POA: Insufficient documentation

## 2013-12-08 DIAGNOSIS — Z87891 Personal history of nicotine dependence: Secondary | ICD-10-CM | POA: Insufficient documentation

## 2013-12-08 DIAGNOSIS — N200 Calculus of kidney: Secondary | ICD-10-CM

## 2013-12-08 DIAGNOSIS — G43909 Migraine, unspecified, not intractable, without status migrainosus: Secondary | ICD-10-CM | POA: Insufficient documentation

## 2013-12-08 HISTORY — PX: CYSTOSCOPY WITH URETEROSCOPY, STONE BASKETRY AND STENT PLACEMENT: SHX6378

## 2013-12-08 HISTORY — PX: HOLMIUM LASER APPLICATION: SHX5852

## 2013-12-08 LAB — POCT HEMOGLOBIN-HEMACUE: HEMOGLOBIN: 13.2 g/dL (ref 12.0–15.0)

## 2013-12-08 LAB — POCT PREGNANCY, URINE: Preg Test, Ur: NEGATIVE

## 2013-12-08 SURGERY — CYSTOSCOPY, WITH CALCULUS MANIPULATION OR REMOVAL
Anesthesia: General | Site: Ureter | Laterality: Right

## 2013-12-08 MED ORDER — OXYCODONE HCL 5 MG/5ML PO SOLN
5.0000 mg | Freq: Once | ORAL | Status: DC | PRN
  Filled 2013-12-08: qty 5

## 2013-12-08 MED ORDER — PROMETHAZINE HCL 25 MG/ML IJ SOLN
INTRAMUSCULAR | Status: AC
  Filled 2013-12-08: qty 1

## 2013-12-08 MED ORDER — LIDOCAINE HCL (CARDIAC) 20 MG/ML IV SOLN
INTRAVENOUS | Status: DC | PRN
  Administered 2013-12-08: 50 mg via INTRAVENOUS

## 2013-12-08 MED ORDER — CIPROFLOXACIN IN D5W 400 MG/200ML IV SOLN
INTRAVENOUS | Status: AC
  Filled 2013-12-08: qty 200

## 2013-12-08 MED ORDER — SODIUM CHLORIDE 0.9 % IR SOLN
Status: DC | PRN
  Administered 2013-12-08: 4500 mL

## 2013-12-08 MED ORDER — EPHEDRINE SULFATE 50 MG/ML IJ SOLN
INTRAMUSCULAR | Status: DC | PRN
  Administered 2013-12-08: 10 mg via INTRAVENOUS

## 2013-12-08 MED ORDER — FENTANYL CITRATE 0.05 MG/ML IJ SOLN
INTRAMUSCULAR | Status: AC
  Filled 2013-12-08: qty 6

## 2013-12-08 MED ORDER — AMOXICILLIN-POT CLAVULANATE 500-125 MG PO TABS: 1.0000 | ORAL_TABLET | Freq: Two times a day (BID) | ORAL | Status: DC

## 2013-12-08 MED ORDER — FENTANYL CITRATE 0.05 MG/ML IJ SOLN
INTRAMUSCULAR | Status: DC | PRN
  Administered 2013-12-08: 12.5 ug via INTRAVENOUS
  Administered 2013-12-08: 25 ug via INTRAVENOUS
  Administered 2013-12-08 (×2): 12.5 ug via INTRAVENOUS
  Administered 2013-12-08: 50 ug via INTRAVENOUS
  Administered 2013-12-08: 12.5 ug via INTRAVENOUS
  Administered 2013-12-08: 25 ug via INTRAVENOUS
  Administered 2013-12-08 (×2): 12.5 ug via INTRAVENOUS
  Administered 2013-12-08: 25 ug via INTRAVENOUS

## 2013-12-08 MED ORDER — GENTAMICIN SULFATE 40 MG/ML IJ SOLN
120.0000 mg | INTRAVENOUS | Status: AC
  Administered 2013-12-08: 120 mg via INTRAVENOUS
  Filled 2013-12-08: qty 3

## 2013-12-08 MED ORDER — LACTATED RINGERS IV SOLN
INTRAVENOUS | Status: DC
  Administered 2013-12-08 (×2): via INTRAVENOUS
  Filled 2013-12-08: qty 1000

## 2013-12-08 MED ORDER — KETOROLAC TROMETHAMINE 30 MG/ML IJ SOLN
INTRAMUSCULAR | Status: DC | PRN
  Administered 2013-12-08: 30 mg via INTRAVENOUS

## 2013-12-08 MED ORDER — IOHEXOL 350 MG/ML SOLN
INTRAVENOUS | Status: DC | PRN
  Administered 2013-12-08: 27 mL

## 2013-12-08 MED ORDER — ONDANSETRON HCL 4 MG/2ML IJ SOLN
INTRAMUSCULAR | Status: DC | PRN
  Administered 2013-12-08: 4 mg via INTRAVENOUS

## 2013-12-08 MED ORDER — CIPROFLOXACIN IN D5W 400 MG/200ML IV SOLN
400.0000 mg | Freq: Once | INTRAVENOUS | Status: DC
  Administered 2013-12-08: 400 mg via INTRAVENOUS
  Filled 2013-12-08: qty 200

## 2013-12-08 MED ORDER — PROMETHAZINE HCL 25 MG/ML IJ SOLN
6.2500 mg | INTRAMUSCULAR | Status: AC | PRN
  Administered 2013-12-08 (×2): 6.25 mg via INTRAVENOUS
  Filled 2013-12-08: qty 1

## 2013-12-08 MED ORDER — OXYCODONE HCL 5 MG PO TABS
5.0000 mg | ORAL_TABLET | Freq: Once | ORAL | Status: DC | PRN
  Filled 2013-12-08: qty 1

## 2013-12-08 MED ORDER — ACETAMINOPHEN 10 MG/ML IV SOLN
INTRAVENOUS | Status: DC | PRN
  Administered 2013-12-08: 1000 mg via INTRAVENOUS

## 2013-12-08 MED ORDER — HYDROMORPHONE HCL PF 1 MG/ML IJ SOLN
0.2500 mg | INTRAMUSCULAR | Status: DC | PRN
  Administered 2013-12-08: 0.25 mg via INTRAVENOUS
  Filled 2013-12-08: qty 1

## 2013-12-08 MED ORDER — PROPOFOL 10 MG/ML IV BOLUS
INTRAVENOUS | Status: DC | PRN
  Administered 2013-12-08: 50 mg via INTRAVENOUS
  Administered 2013-12-08: 200 mg via INTRAVENOUS
  Administered 2013-12-08: 50 mg via INTRAVENOUS

## 2013-12-08 MED ORDER — MEPERIDINE HCL 25 MG/ML IJ SOLN
6.2500 mg | INTRAMUSCULAR | Status: DC | PRN
  Filled 2013-12-08: qty 1

## 2013-12-08 MED ORDER — DEXAMETHASONE SODIUM PHOSPHATE 4 MG/ML IJ SOLN
INTRAMUSCULAR | Status: DC | PRN
  Administered 2013-12-08: 10 mg via INTRAVENOUS

## 2013-12-08 MED ORDER — LACTATED RINGERS IV SOLN
INTRAVENOUS | Status: DC | PRN
  Administered 2013-12-08 (×2): via INTRAVENOUS

## 2013-12-08 MED ORDER — HYDROMORPHONE HCL PF 1 MG/ML IJ SOLN
INTRAMUSCULAR | Status: AC
  Filled 2013-12-08: qty 1

## 2013-12-08 MED ORDER — HYDROCODONE-ACETAMINOPHEN 5-325 MG PO TABS: 1.0000 | ORAL_TABLET | Freq: Four times a day (QID) | ORAL | Status: DC | PRN

## 2013-12-08 MED ORDER — GLYCOPYRROLATE 0.2 MG/ML IJ SOLN
INTRAMUSCULAR | Status: DC | PRN
  Administered 2013-12-08: 0.2 mg via INTRAVENOUS

## 2013-12-08 SURGICAL SUPPLY — 26 items
BAG DRAIN URO-CYSTO SKYTR STRL (DRAIN) ×2 IMPLANT
BAG DRN UROCATH (DRAIN) ×1
BASKET ZERO TIP NITINOL 2.4FR (BASKET) ×1 IMPLANT
BSKT STON RTRVL ZERO TP 2.4FR (BASKET) ×1
CANISTER SUCT LVC 12 LTR MEDI- (MISCELLANEOUS) ×1 IMPLANT
CATH INTERMIT  6FR 70CM (CATHETERS) ×2 IMPLANT
CLOTH BEACON ORANGE TIMEOUT ST (SAFETY) ×2 IMPLANT
DRAPE CAMERA CLOSED 9X96 (DRAPES) ×2 IMPLANT
FIBER LASER TRAC TIP (UROLOGICAL SUPPLIES) ×1 IMPLANT
GLOVE BIO SURGEON STRL SZ 6.5 (GLOVE) ×1 IMPLANT
GLOVE BIO SURGEON STRL SZ7.5 (GLOVE) ×1 IMPLANT
GLOVE BIO SURGEON STRL SZ8 (GLOVE) ×2 IMPLANT
GLOVE BIOGEL PI IND STRL 7.5 (GLOVE) IMPLANT
GLOVE BIOGEL PI INDICATOR 7.5 (GLOVE) ×1
GLOVE INDICATOR 6.5 STRL GRN (GLOVE) ×2 IMPLANT
GLOVE INDICATOR 7.5 STRL GRN (GLOVE) ×1 IMPLANT
GOWN STRL REUS W/ TWL LRG LVL3 (GOWN DISPOSABLE) IMPLANT
GOWN STRL REUS W/TWL LRG LVL3 (GOWN DISPOSABLE) ×4
GOWN STRL REUS W/TWL XL LVL3 (GOWN DISPOSABLE) ×2 IMPLANT
GUIDEWIRE STR DUAL SENSOR (WIRE) ×1 IMPLANT
IV NS 1000ML (IV SOLUTION) ×2
IV NS 1000ML BAXH (IV SOLUTION) IMPLANT
IV NS IRRIG 3000ML ARTHROMATIC (IV SOLUTION) ×3 IMPLANT
PACK CYSTOSCOPY (CUSTOM PROCEDURE TRAY) ×2 IMPLANT
SHEATH ACCESS URETERAL 38CM (SHEATH) ×1 IMPLANT
STENT URET 6FRX24 CONTOUR (STENTS) ×1 IMPLANT

## 2013-12-08 NOTE — Transfer of Care (Signed)
Immediate Anesthesia Transfer of Care Note  Patient: Joan Orozco  Procedure(s) Performed: Procedure(s) (LRB): CYSTO/RIGHT URETERAL STENT PLACEMENT/STONE EXTRACTION (Right) HOLMIUM LASER LITHOTRIPSY (Right)  Patient Location: PACU  Anesthesia Type: General  Level of Consciousness: awake, sedated, patient cooperative and responds to stimulation  Airway & Oxygen Therapy: Patient Spontanous Breathing and Patient connected to face mask oxygen  Post-op Assessment: Report given to PACU RN, Post -op Vital signs reviewed and stable and Patient moving all extremities  Post vital signs: Reviewed and stable  Complications: No apparent anesthesia complications

## 2013-12-08 NOTE — Anesthesia Preprocedure Evaluation (Addendum)
Anesthesia Evaluation  Patient identified by MRN, date of birth, ID band Patient awake    Reviewed: Allergy & Precautions, H&P , Patient's Chart, lab work & pertinent test results  Airway Mallampati: III TM Distance: >3 FB Neck ROM: full    Dental no notable dental hx. (+) Teeth Intact   Pulmonary asthma , former smoker,  breath sounds clear to auscultation  Pulmonary exam normal       Cardiovascular negative cardio ROS  Rhythm:regular Rate:Normal     Neuro/Psych  Headaches, PSYCHIATRIC DISORDERS Anxiety Hx/o Bell's palsy    GI/Hepatic negative GI ROS, Neg liver ROS,   Endo/Other  negative endocrine ROS  Renal/GU negative Renal ROS     Musculoskeletal   Abdominal Normal abdominal exam  (+)   Peds  Hematology negative hematology ROS (+)   Anesthesia Other Findings   Reproductive/Obstetrics negative OB ROS                           Anesthesia Physical  Anesthesia Plan  ASA: II  Anesthesia Plan: General   Post-op Pain Management:    Induction: Intravenous  Airway Management Planned: LMA  Additional Equipment:   Intra-op Plan:   Post-operative Plan: Extubation in OR  Informed Consent: I have reviewed the patients History and Physical, chart, labs and discussed the procedure including the risks, benefits and alternatives for the proposed anesthesia with the patient or authorized representative who has indicated his/her understanding and acceptance.   Dental advisory given  Plan Discussed with: CRNA  Anesthesia Plan Comments:        Anesthesia Quick Evaluation

## 2013-12-08 NOTE — Anesthesia Procedure Notes (Signed)
Procedure Name: LMA Insertion Date/Time: 12/08/2013 2:27 PM Performed by: Jessica PriestBEESON, Joan Kerby C Pre-anesthesia Checklist: Patient identified, Emergency Drugs available, Suction available and Patient being monitored Patient Re-evaluated:Patient Re-evaluated prior to inductionOxygen Delivery Method: Circle System Utilized Preoxygenation: Pre-oxygenation with 100% oxygen Intubation Type: IV induction Ventilation: Mask ventilation without difficulty LMA: LMA inserted LMA Size: 4.0 Number of attempts: 1 Airway Equipment and Method: bite block Placement Confirmation: positive ETCO2 Tube secured with: Tape Dental Injury: Teeth and Oropharynx as per pre-operative assessment

## 2013-12-08 NOTE — H&P (Signed)
H&P  Chief Complaint: kidney stone  History of Present Illness: Joan Orozco is a 28 y.o. year old female who presents at this time for endoscopic management of a 13 mm right renal pelvic stone.  She originally presented to her practice on 11/17/2013 with a several month history of intermittent right flank pain with nausea. Prior to her presentation she underwent a renal ultrasound which revealed bilateral nephrolithiasis-13 mm right renal pelvic stone without hydro-, a 6 mm left lower pole stone.  She presents for management of that right stone.  There has been no recent history of urinary tract infection.  She is breast-feeding.  Past Medical History  Diagnosis Date  . Anxiety     at younger age. was on lexapro  . Infection     bladder, yeast, uri.  . Asthma     childhood  . Abnormal Pap smear     repeat WNL  . History of Bell's palsy   . Migraine     frequently    Past Surgical History  Procedure Laterality Date  . Wisdom tooth extraction    . Tooth extraction      Home Medications:  No prescriptions prior to admission    Allergies:  Allergies  Allergen Reactions  . Cefdinir [Omni-Pac] Rash    Has tolerated PCN, Ampicillin, Keflex.  Allergy occurred as a child, had localized rash on arm, not hives (took med orally).  Rash could have been coincidental.    Family History  Problem Relation Age of Onset  . Adopted: Yes    Social History:  reports that she quit smoking about 9 years ago. Her smoking use included Cigarettes. She smoked 0.00 packs per day. She has never used smokeless tobacco. She reports that she does not drink alcohol or use illicit drugs.  ROS: Skin, eye, otolaryngeal, hematologic/lymphatic, cardiovascular, pulmonary, endocrine and musculoskeletal system(s) were reviewed and pertinent findings if present are noted.  Genitourinary: no urinary frequency, no urinary urgency, no dysuria, no nocturia and no incontinence.  Gastrointestinal: flank  pain, but no nausea and no vomiting.  Constitutional: no fever and no night sweats.   Physical Exam:  Vital signs in last 24 hours:   Constitutional:. No acute distress. The patient appears well hydrated.  ENT:. The ears and nose are normal in appearance.  Pulmonary: No respiratory distress.  Cardiovascular: Heart rate and rhythm are normal.  Abdomen: The abdomen is soft and nontender. No CVA tenderness.  Skin: Normal skin turgor.  Neuro/Psych:. Mood and affect are appropriate.  Laboratory Data:  No results found for this or any previous visit (from the past 24 hour(s)). No results found for this or any previous visit (from the past 240 hour(s)). Creatinine: No results found for this basename: CREATININE,  in the last 168 hours  Radiologic Imaging: No results found.  Impression/Assessment:  13 mm right renal calculus, intermittently symptomatic  Plan:  Cystoscopy, right retrograde ureteral pyelogram, right ureteroscopy with holmium laser, extraction of stone, probable stent placement  Bray Vickerman M 12/08/2013, 12:07 PM  Bertram MillardStephen M. Kaylynn Chamblin MD

## 2013-12-08 NOTE — Discharge Instructions (Signed)
°  Post Anesthesia Home Care Instructions  Activity: Get plenty of rest for the remainder of the day. A responsible adult should stay with you for 24 hours following the procedure.  For the next 24 hours, DO NOT: -Drive a car -Advertising copywriterperate machinery -Drink alcoholic beverages -Take any medication unless instructed by your physician -Make any legal decisions or sign important papers.  Meals: Start with liquid foods such as gelatin or soup. Progress to regular foods as tolerated. Avoid greasy, spicy, heavy foods. If nausea and/or vomiting occur, drink only clear liquids until the nausea and/or vomiting subsides. Call your physician if vomiting continues.  Special Instructions/Symptoms: Your throat may feel dry or sore from the anesthesia or the breathing tube placed in your throat during surgery. If this causes discomfort, gargle with warm salt water. The discomfort should disapwwill often be given an augs. ent to monitor your progress. Call for this appointment at the number listed above. Usually the first appointment will be about three to fourteen days after your surgery.  DIET: You may return to your normal diet immediately. Because of the raw surface of your bladder, alcohol, spicy foods, acid type foods and drinks with caffeine may cause irritation or frequency and should be used in moderation. To keep your urine flowing freely and to avoid constipation, drink plenty of fluids during the day ( 8-10 glasses ). Tip: Avoid cranberry juice because it is very acidic.  ACTIVITY: Your physical activity doesn't need to be restricted. However, if you are very active, you may see some blood in your urine. We suggest that you reduce your activity under these circumstances until the bleeding has stopped.  BOWELS: It is important to keep your bowels regular during the postoperative period. Straining with bowel movements can cause bleeding. A bowel movement every other day is reasonable. Use a mild  laxative if needed, such as Milk of Magnesia 2-3 tablespoons, or 2 Dulcolax tablets. Call if you continue to have problems. If you have been taking narcotics for pain, before, during or after your surgery, you may be constipated. Take a laxative if necessary.   MEDICATION: You should resume your pre-surgery medications unless told not to. In addition you will often be given an antibiotic to prevent infection. These should be taken as prescribed until the bottles are finished unless you are having an unusual reaction to one of the drugs.  PROBLEMS YOU SHOULD REPORT TO US:  Fevers over 100.5 Fahrenheit.  Heavy bleeding, or clots ( See above notes about blood in urine ).  Inability to urinate.  Drug reactions ( hives, rash, nausea, vomiting, diarrhea ).  Severe burning or pain with urination that is not improving.  FOLLOW-UP: You will need a follow-up appointment to monitor your progress. Call for this appointment at the number listed above. Usually the first appointment will be about three to fourteen days after your surgery.

## 2013-12-08 NOTE — Op Note (Signed)
Preoperative Diagnosis: nephrolithiasis  Postoperative Diagnosis:  same  Procedure(s) Performed:  Cystourethroscopy, Right ureteral stent placement, Right ureteroscopy, Laser lithotripsy, Basket stone extraction, Right retrograde pyelogram and Intraoperative fluoroscopy with interpretation <1 hr  Teaching Surgeon:  Marcine MatarStephen Dahlstedt, MD  Resident Surgeon:  Dyke BrackettJames "Jed" Laelah Siravo, MD  Assistant(s):  none  Anesthesia:  General via LMA  Fluids:  See anesthesia record  Estimated blood loss:  5 mL  Specimens:  Stone collected and given to patient  Cultures:  none  Drains:  6x24cm right contour with string dangler  Complications:  none  Indications: 27yF with 8 months of R flank pain during pregnancy leading to diagnosis of 1.3cm renal pelvis stone. After discussion of risks/benefits/alternative therapies, the patient agreed to the above procedure.  Findings:  1. Cysourethroscopy without mucosal abnormalities 2. R RPG revealed a normal contour ureter up to non-dilated pelvis with large filling defect corresponding to the stone. 3. Ureteroscopy revealed normal ureter and collecting system. There was some irritation and debris around a single calcium oxalate dihydrate appearing stone. 4. There were some Randall's plaques. No stones >902mm at the end of the case. 5. Stent placed with good curl in renal pelvis and bladder.  Description:  The patient was correctly identified in the preop holding area where written informed consent as well potential risk and complication reviewed. She agreed. They were brought back to the operative suite where a preinduction timeout was performed. Once correct information was verified, general anesthesia was induced via endotracheal tube. They were then gently placed into dorsal lithotomy position with SCDs in place for VTE prophylaxis. They were prepped and draped in the usual sterile fashion and given appropriate preoperative antibiotics ciprofloxacin. However,  she had a rash on her right arm, and this was discontinued. Gentamicin was then instilled.  We inserted a 47F rigid cystoscope per urethra with copious lubrication and normal saline irrigation running. This demonstrated findings as described above.   A 5Fr open ended catheter was inserted into the R UO and a RPG was performed, findings as above. A sensor wire was placed up to the kidney.  I advanced the inner portion of 12/14 Fr ureteral access sheath, which advanced easily. The wire was removed and a digital ureteroscope was advanced up to the kidney and the stone was encountered. Holmium laser energy on 0.6J/6Hz  was used to break the stone easily into basketable fragments which were meticulously removed with zero tip nitinol basket. All calyces were inspected and she was found to be stone-free flouroscopically and visually.  A wire was replaced and the sheath was removed and the entire ureter was inspected along with a pull back RPG, and no extravasation or mucosal injuries were noted.  The cystoscope was backloaded over the wire and a stent was placed with good curl in the renal pelvis and bladder.  The patient's bladder was emptied, they were awoken from anesthesia, and taken to the recovery room in good condition.   Post Op Plan:   1. Discharge patient home when meets PACU criteria. 2. Remove stent by string in 5 days. 3. "pump and dump" while taking pain meds/antibiotics.  Attestation:  Dr. Retta Dionesahlstedt was present for the entire procedure.    Janice Bodine "Jed" Emelda FearFerguson, MD Resident, Department of Urology

## 2013-12-09 ENCOUNTER — Encounter (HOSPITAL_BASED_OUTPATIENT_CLINIC_OR_DEPARTMENT_OTHER): Payer: Self-pay | Admitting: Urology

## 2013-12-09 NOTE — Anesthesia Postprocedure Evaluation (Signed)
Anesthesia Post Note  Patient: Joan SavoyJennifer B Orozco  Procedure(s) Performed: Procedure(s) (LRB): CYSTO/RIGHT URETERAL STENT PLACEMENT/STONE EXTRACTION (Right) HOLMIUM LASER LITHOTRIPSY (Right)  Anesthesia type: General  Patient location: PACU  Post pain: Pain level controlled  Post assessment: Post-op Vital signs reviewed  Last Vitals: BP 108/72  Pulse 70  Temp(Src) 36.1 C (Oral)  Resp 14  Ht 5\' 3"  (1.6 m)  Wt 136 lb 8 oz (61.916 kg)  BMI 24.19 kg/m2  SpO2 100%  LMP 09/04/2013  Breastfeeding? Yes  Post vital signs: Reviewed  Level of consciousness: sedated  Complications: No apparent anesthesia complications\

## 2013-12-12 NOTE — Op Note (Signed)
I supervised and was present for the entire procedure.

## 2014-02-27 ENCOUNTER — Encounter (HOSPITAL_BASED_OUTPATIENT_CLINIC_OR_DEPARTMENT_OTHER): Payer: Self-pay | Admitting: Urology

## 2015-11-06 ENCOUNTER — Ambulatory Visit (INDEPENDENT_AMBULATORY_CARE_PROVIDER_SITE_OTHER): Payer: BLUE CROSS/BLUE SHIELD | Admitting: Neurology

## 2015-11-06 ENCOUNTER — Encounter: Payer: Self-pay | Admitting: Neurology

## 2015-11-06 VITALS — BP 102/68 | HR 76 | Resp 20 | Ht 63.0 in | Wt 139.0 lb

## 2015-11-06 DIAGNOSIS — G43111 Migraine with aura, intractable, with status migrainosus: Secondary | ICD-10-CM

## 2015-11-06 DIAGNOSIS — G43831 Menstrual migraine, intractable, with status migrainosus: Secondary | ICD-10-CM | POA: Diagnosis not present

## 2015-11-06 DIAGNOSIS — N200 Calculus of kidney: Secondary | ICD-10-CM | POA: Diagnosis not present

## 2015-11-06 MED ORDER — PROMETHAZINE HCL 12.5 MG PO TABS: 12.5000 mg | ORAL_TABLET | Freq: Four times a day (QID) | ORAL | Status: AC | PRN

## 2015-11-06 MED ORDER — PROPRANOLOL HCL ER 60 MG PO CP24: 60.0000 mg | ORAL_CAPSULE | Freq: Every day | ORAL | Status: DC

## 2015-11-06 MED ORDER — SUMATRIPTAN SUCCINATE 50 MG PO TABS: 50.0000 mg | ORAL_TABLET | Freq: Once | ORAL | Status: DC

## 2015-11-06 NOTE — Patient Instructions (Addendum)
Migraine Headache A migraine headache is an intense, throbbing pain on one or both sides of your head. A migraine can last for 30 minutes to several hours. CAUSES  The exact cause of a migraine headache is not always known. However, a migraine may be caused when nerves in the brain become irritated and release chemicals that cause inflammation. This causes pain. Certain things may also trigger migraines, such as:  Alcohol.  Smoking.  Stress.  Menstruation.  Aged cheeses.  Foods or drinks that contain nitrates, glutamate, aspartame, or tyramine.  Lack of sleep.  Chocolate.  Caffeine.  Hunger.  Physical exertion.  Fatigue.  Medicines used to treat chest pain (nitroglycerine), birth control pills, estrogen, and some blood pressure medicines. SIGNS AND SYMPTOMS  Pain on one or both sides of your head.  Pulsating or throbbing pain.  Severe pain that prevents daily activities.  Pain that is aggravated by any physical activity.  Nausea, vomiting, or both.  Dizziness.  Pain with exposure to bright lights, loud noises, or activity.  General sensitivity to bright lights, loud noises, or smells. Before you get a migraine, you may get warning signs that a migraine is coming (aura). An aura may include:  Seeing flashing lights.  Seeing bright spots, halos, or zigzag lines.  Having tunnel vision or blurred vision.  Having feelings of numbness or tingling.  Having trouble talking.  Having muscle weakness. DIAGNOSIS  A migraine headache is often diagnosed based on:  Symptoms.  Physical exam.  A CT scan or MRI of your head. These imaging tests cannot diagnose migraines, but they can help rule out other causes of headaches. TREATMENT Medicines may be given for pain and nausea. Medicines can also be given to help prevent recurrent migraines.  HOME CARE INSTRUCTIONS  Only take over-the-counter or prescription medicines for pain or discomfort as directed by your  health care provider. The use of long-term narcotics is not recommended.  Lie down in a dark, quiet room when you have a migraine.  Keep a journal to find out what may trigger your migraine headaches. For example, write down:  What you eat and drink.  How much sleep you get.  Any change to your diet or medicines.  Limit alcohol consumption.  Quit smoking if you smoke.  Get 7-9 hours of sleep, or as recommended by your health care provider.  Limit stress.  Keep lights dim if bright lights bother you and make your migraines worse. SEEK IMMEDIATE MEDICAL CARE IF:   Your migraine becomes severe.  You have a fever.  You have a stiff neck.  You have vision loss.  You have muscular weakness or loss of muscle control.  You start losing your balance or have trouble walking.  You feel faint or pass out.  You have severe symptoms that are different from your first symptoms. MAKE SURE YOU:   Understand these instructions.  Will watch your condition.  Will get help right away if you are not doing well or get worse.   This information is not intended to replace advice given to you by your health care provider. Make sure you discuss any questions you have with your health care provider.   Document Released: 04/14/2005 Document Revised: 05/05/2014 Document Reviewed: 12/20/2012 Elsevier Interactive Patient Education 2016 [employer] Promethazine tablets What is this medicine? PROMETHAZINE (proe METH a zeen) is an antihistamine. It is used to treat allergic reactions and to treat or prevent nausea and vomiting from illness or motion sickness. It  is also used to make you sleep before surgery, and to help treat pain or nausea after surgery. This medicine may be used for other purposes; ask your health care provider or pharmacist if you have questions. What should I tell my health care provider before I take this medicine? They need to know if you have any of these  conditions: -glaucoma -high blood pressure or heart disease -kidney disease -liver disease -lung or breathing disease, like asthma -prostate trouble -pain or difficulty passing urine -seizures -an unusual or allergic reaction to promethazine or phenothiazines, other medicines, foods, dyes, or preservatives -pregnant or trying to get pregnant -breast-feeding How should I use this medicine? Take this medicine by mouth with a glass of water. Follow the directions on the prescription label. Take your doses at regular intervals. Do not take your medicine more often than directed. Talk to your pediatrician regarding the use of this medicine in children. Special care may be needed. This medicine should not be given to infants and children younger than 65 years old. Overdosage: If you think you have taken too much of this medicine contact a poison control center or emergency room at once. NOTE: This medicine is only for you. Do not share this medicine with others. What if I miss a dose? If you miss a dose, take it as soon as you can. If it is almost time for your next dose, take only that dose. Do not take double or extra doses. What may interact with this medicine? Do not take this medicine with any of the following medications: -cisapride -dofetilide -dronedarone -MAOIs like Carbex, Eldepryl, Marplan, Nardil, Parnate -pimozide -quinidine, including dextromethorphan; quinidine -thioridazine -ziprasidone This medicine may also interact with the following medications: -certain medicines for depression, anxiety, or psychotic disturbances -certain medicines for anxiety or sleep -certain medicines for seizures like carbamazepine, phenobarbital, phenytoin -certain medicines for movement abnormalities as in Parkinson's disease, or for gastrointestinal problems -epinephrine -medicines for allergies or colds -muscle relaxants -narcotic medicines for pain -other medicines that prolong the QT  interval (cause an abnormal heart rhythm) -tramadol -trimethobenzamide This list may not describe all possible interactions. Give your health care provider a list of all the medicines, herbs, non-prescription drugs, or dietary supplements you use. Also tell them if you smoke, drink alcohol, or use illegal drugs. Some items may interact with your medicine. What should I watch for while using this medicine? Tell your doctor or health care professional if your symptoms do not start to get better in 1 to 2 days. You may get drowsy or dizzy. Do not drive, use machinery, or do anything that needs mental alertness until you know how this medicine affects you. To reduce the risk of dizzy or fainting spells, do not stand or sit up quickly, especially if you are an older patient. Alcohol may increase dizziness and drowsiness. Avoid alcoholic drinks. Your mouth may get dry. Chewing sugarless gum or sucking hard candy, and drinking plenty of water may help. Contact your doctor if the problem does not go away or is severe. This medicine may cause dry eyes and blurred vision. If you wear contact lenses you may feel some discomfort. Lubricating drops may help. See your eye doctor if the problem does not go away or is severe. This medicine can make you more sensitive to the sun. Keep out of the sun. If you cannot avoid being in the sun, wear protective clothing and use sunscreen. Do not use sun lamps or tanning beds/booths. If you  are diabetic, check your blood-sugar levels regularly. What side effects may I notice from receiving this medicine? Side effects that you should report to your doctor or health care professional as soon as possible: -blurred vision -irregular heartbeat, palpitations or chest pain -muscle or facial twitches -pain or difficulty passing urine -seizures -skin rash -slowed or shallow breathing -unusual bleeding or bruising -yellowing of the eyes or skin Side effects that usually do not  require medical attention (report to your doctor or health care professional if they continue or are bothersome): -headache -nightmares, agitation, nervousness, excitability, not able to sleep (these are more likely in children) -stuffy nose This list may not describe all possible side effects. Call your doctor for medical advice about side effects. You may report side effects to FDA at 1-800-FDA-1088. Where should I keep my medicine? Keep out of the reach of children. Store at room temperature, between 20 and 25 degrees C (68 and 77 degrees F). Protect from light. Throw away any unused medicine after the expiration date. NOTE: This sheet is a summary. It may not cover all possible information. If you have questions about this medicine, talk to your doctor, pharmacist, or health care provider.    2016, Elsevier/Gold Standard. (2012-12-14 15:04:46)

## 2015-11-06 NOTE — Addendum Note (Signed)
Addended by: Melvyn NovasHMEIER, Ellyson Rarick on: 11/06/2015 02:38 PM   Modules accepted: Orders

## 2015-11-06 NOTE — Progress Notes (Signed)
Provider:  Melvyn Novasarmen  Salimah Martinovich, M D  Referring Provider: Catalina Antiguaonstant, Peggy, MD, Obstetrician.  Primary Care Physician:    Chief Complaint  Patient presents with  . New Patient (Initial Visit)    migraines,usually has 5-10 a month, imitrex sometimes helps    HPI:  Joan Orozco is a 30 y.o. female  Is seen here as a referral from Dr. Knox RoyaltyEnrico Jones.  Mrs. Joan Orozco is a mother of 2 and reports that she has had migraines for at least 10 years. Initially she was not sure what kind of headaches she had until she talked to a coworker, now husband. She uses an intrauterine device for contraception but has tried birth control patches and injections before these preceded the onset of headaches. It is not clear what led to the migraine development and the patient denies being sleep deprived. She has noticed that alcohol provokes headaches but she drinks very rarely. She is not a tobacco user. She does use caffeine but not in form of coffee. She sometimes drinks green tea, not ice tea and rarely soda. She gave up drinking caffeine related coffee while being pregnant or nursing. Dr. Loma Senderharles Trumbull, who became her father-in-law, ordered Imitrex which did help her kind of headaches after they were identified as migraines. She now still has about 10  Per months, one or 2 in a week but no longer in one cluster as it used to be. This changed about 2 months ago. Her migrainous headaches arise as the day goes on usually after Sharma CovertNorman evening she usually does not wake up with a migrainous headache. They're associated with phonophobia and olfactory triggers, loud sounds are bothersome. She has severe nausea with her headaches. She has vomited at times.  Her headaches usually affect the right more often than the left but they are usually not bilateral.  The headaches affected her prior to what a prescription of Imitrex to a degree where she had no longer the ability to work and had to go home. She will take a hot  sharp shower height in a dark room and try not to move or speak. 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  Another trigger is dehydration, MSG in food.  She sometimes couldn't fall asleep and then would wake up when the headaches had past, sometimes she had the migraine all the following day, status migrainosus.   The patient currently has sumatriptan around the generic form of Imitrex available at 50 mA tablets. She took propranolol in the past, but stopped it with her first pregnancy. Lab results from her recent office visit with Dr. Yetta BarreJones from 10/04/2015 were normal. These included a urinalysis, glucose, and her vision acuity testing. She also has listed when necessary a prescription for tramadol 50 mg and sometimes takes ibuprofen 400-600 mg.  Review of Systems: Out of a complete 14 system review, the patient complains of only the following symptoms, and all other reviewed systems are negative. Frequent, disabling migraines associated with nausea, phonophobia, photophobia, olfactory triggers. Olfactory aura.   Social History   Social History  . Marital Status: Married    Spouse Name: N/A  . Number of Children: N/A  . Years of Education: N/A   Occupational History  . Not on file.   Social History Main Topics  . Smoking status: Former Smoker    Types: Cigarettes    Quit date: 10/15/2004  . Smokeless tobacco: Never Used  . Alcohol Use: No     Comment:  social  . Drug Use: No     Comment: not for many years  . Sexual Activity:    Partners: Male    Pharmacist, hospital Protection: IUD   Other Topics Concern  . Not on file   Social History Narrative    Family History  Problem Relation Age of Onset  . Adopted: Yes  . ADD / ADHD Father     Past Medical History  Diagnosis Date  . Anxiety     at younger age. was on lexapro  . Infection     bladder, yeast, uri.  . Asthma     childhood  . Abnormal Pap smear     repeat WNL  . History of Bell's palsy   . Migraine      frequently    Past Surgical History  Procedure Laterality Date  . Wisdom tooth extraction    . Tooth extraction    . Cystoscopy with ureteroscopy, stone basketry and stent placement Right 12/08/2013    Procedure: CYSTO/RIGHT URETERAL STENT PLACEMENT/STONE EXTRACTION;  Surgeon: Chelsea Aus, MD;  Location: [facility];  Service: Urology;  Laterality: Right;  . Holmium laser application Right 12/08/2013    Procedure: HOLMIUM LASER LITHOTRIPSY;  Surgeon: Chelsea Aus, MD;  Location: [facility];  Service: Urology;  Laterality: Right;    Current Outpatient Prescriptions  Medication Sig Dispense Refill  . acetaminophen (TYLENOL) 500 MG tablet Take 1,000 mg by mouth every 6 (six) hours as needed for moderate pain. For heartburn    . ibuprofen (ADVIL,MOTRIN) 200 MG tablet Take 400-600 mg by mouth every 6 (six) hours as needed for headache.    Marland Kitchen omeprazole (PRILOSEC) 10 MG capsule Take 10 mg by mouth daily as needed.     . SUMAtriptan (IMITREX) 50 MG tablet 25 mg.  0   No current facility-administered medications for this visit.    Allergies as of 11/06/2015 - Review Complete 11/06/2015  Allergen Reaction Noted  . Ciprofloxacin Rash 12/08/2013  . Cefdinir [omni-pac] Rash 02/06/2011    Vitals: BP 102/68 mmHg  Pulse 76  Resp 20  Ht 5\' 3"  (1.6 m)  Wt 139 lb (63.05 kg)  BMI 24.63 kg/m2 Last Weight:  Wt Readings from Last 1 Encounters:  11/06/15 139 lb (63.05 kg)   Last Height:   Ht Readings from Last 1 Encounters:  11/06/15 5\' 3"  (1.6 m)    Physical exam:  General: The patient is awake, alert and appears not in acute distress. The patient is well groomed. Head: Normocephalic, atraumatic. Neck is supple. Mallampati 3, neck circumference: 14 Cardiovascular:  Regular rate and rhythm, without  murmurs or carotid bruit, and without distended neck veins. Respiratory: Lungs are clear to auscultation. Skin:  Without evidence of edema, or  rash Trunk: BMI is normal and patient  has normal posture.  Neurologic exam : The patient is awake and alert, oriented to place and time.  Memory subjective  described as intact. There is a normal attention span & concentration ability. Speech is fluent without  dysarthria, dysphonia or aphasia. Mood and affect are appropriate.  Cranial nerves: Pupils are equal and briskly reactive to light.  Funduscopic exam without evidence of pallor or edema. Extraocular movements  in vertical and horizontal planes intact and without nystagmus.  Visual fields by finger perimetry are intact. Hearing to finger rub intact.  Facial sensation intact to fine touch. Facial motor strength is symmetric and tongue and uvula move midline. Tongue protrusion into  either cheek is normal. Shoulder shrug is normal.   Motor exam: Normal tone, muscle bulk and symmetric  strength in all extremities.  Sensory:  Fine touch, pinprick and vibration were tested in all extremities. Proprioception was normal. Coordination: Rapid alternating movements in the fingers/hands were normal. Finger-to-nose maneuver  normal without evidence of ataxia, dysmetria or tremor.  Gait and station: Patient walks without assistive device and is able unassisted to climb up to the exam table. Strength within normal limits. Stance is stable and normal. Tandem gait is unfragmented. Romberg testing is negative   Deep tendon reflexes: in the  upper and lower extremities are symmetric and intact. Babinski maneuver response is down-going.   Assessment:   45 minute visit with more than 50% of the time dedicated to face to face discussion and evaluation, gathering of anamnesis.  After physical and neurologic examination, review of laboratory studies, imaging, neurophysiology testing and pre-existing records, assessment is that of :   Mrs. Oliveria reports that she has had migraines for about 12 years, and that the only prophylactic that have been tried with  her was propranolol which she has discontinued in her first pregnancy. Unfortunately she developed kidney stones and one of her pregnancies which will make it harder for her to use topiramate which can foster development of kidney stones. I usually also likes zonisamide as a migraine preferred preventer, and another alternative is to go back to the propranolol since she is currently not pregnant and not planning on another pregnancy soon.  Plan:  Treatment plan and additional workup :  Mrs. Wix never had undergone an MRI and has never been imaged. This will be necessary, she doesn't suffer form sinusitis, asthma and has a history of Kidney stones.  For prophylaxis ,I  will restart this previously well tolerated medication.  In addition, brain MRI and Phenergan po.  Imitrex refill as nasal spray.     Rv with NP or me in 4-6 weeks.  Porfirio Mylar Malay Fantroy MD 11/06/2015

## 2015-11-07 LAB — COMPREHENSIVE METABOLIC PANEL
ALT: 11 IU/L (ref 0–32)
AST: 16 IU/L (ref 0–40)
Albumin/Globulin Ratio: 1.8 (ref 1.2–2.2)
Albumin: 4.6 g/dL (ref 3.5–5.5)
Alkaline Phosphatase: 66 IU/L (ref 39–117)
BILIRUBIN TOTAL: 0.4 mg/dL (ref 0.0–1.2)
BUN / CREAT RATIO: 10 (ref 9–23)
BUN: 9 mg/dL (ref 6–20)
CHLORIDE: 103 mmol/L (ref 96–106)
CO2: 22 mmol/L (ref 18–29)
Calcium: 8.9 mg/dL (ref 8.7–10.2)
Creatinine, Ser: 0.92 mg/dL (ref 0.57–1.00)
GFR calc Af Amer: 97 mL/min/{1.73_m2} (ref >59)
GFR calc non Af Amer: 84 mL/min/{1.73_m2} (ref >59)
GLUCOSE: 92 mg/dL (ref 65–99)
Globulin, Total: 2.5 g/dL (ref 1.5–4.5)
Potassium: 4.3 mmol/L (ref 3.5–5.2)
SODIUM: 141 mmol/L (ref 134–144)
Total Protein: 7.1 g/dL (ref 6.0–8.5)

## 2015-11-15 ENCOUNTER — Telehealth: Payer: Self-pay

## 2015-11-15 NOTE — Telephone Encounter (Signed)
-----   Message from Melvyn Novasarmen Dohmeier, MD sent at 11/14/2015 11:24 AM EDT ----- Normal CMET

## 2015-11-15 NOTE — Telephone Encounter (Signed)
I spoke to pt and advised her that her labs were normal. Pt verbalized understanding of results.  Pt is asking when her MRI will be scheduled?

## 2015-11-16 ENCOUNTER — Telehealth: Payer: Self-pay | Admitting: Neurology

## 2015-11-16 NOTE — Telephone Encounter (Signed)
Patient's MRI is requiring a P2P. Please call [phone] there is no case number but the case will expire within 2 business days.

## 2015-11-16 NOTE — Telephone Encounter (Signed)
Joan Orozco physical address did not match her insurance records and therefore the study was not preauthorized. CD

## 2015-11-20 ENCOUNTER — Telehealth: Payer: Self-pay

## 2015-11-20 NOTE — Telephone Encounter (Signed)
Dr. Vickey Huger completed Peer to Peer. It was approved. Ref # [id number]

## 2015-12-04 ENCOUNTER — Ambulatory Visit
Admission: RE | Admit: 2015-12-04 | Discharge: 2015-12-04 | Disposition: A | Discharge status: Home / Self Care | Payer: BLUE CROSS/BLUE SHIELD | Source: Ambulatory Visit | Attending: Neurology | Admitting: Neurology

## 2015-12-04 DIAGNOSIS — G43111 Migraine with aura, intractable, with status migrainosus: Secondary | ICD-10-CM | POA: Diagnosis not present

## 2015-12-04 DIAGNOSIS — N200 Calculus of kidney: Secondary | ICD-10-CM

## 2015-12-04 DIAGNOSIS — G43831 Menstrual migraine, intractable, with status migrainosus: Secondary | ICD-10-CM

## 2015-12-04 MED ORDER — GADOBENATE DIMEGLUMINE 529 MG/ML IV SOLN
13.0000 mL | Freq: Once | INTRAVENOUS | Status: AC | PRN
  Administered 2015-12-04: 13 mL via INTRAVENOUS

## 2015-12-10 ENCOUNTER — Telehealth: Payer: Self-pay

## 2015-12-10 NOTE — Telephone Encounter (Signed)
I spoke to patient and rescheduled appt because provider will be ooo. She was able to reschedule for next week but I will call back if something sooner opens.

## 2015-12-11 ENCOUNTER — Ambulatory Visit: Payer: BLUE CROSS/BLUE SHIELD | Admitting: Adult Health

## 2015-12-19 ENCOUNTER — Encounter: Payer: Self-pay | Admitting: Adult Health

## 2015-12-19 ENCOUNTER — Ambulatory Visit (INDEPENDENT_AMBULATORY_CARE_PROVIDER_SITE_OTHER): Payer: Self-pay | Admitting: Adult Health

## 2015-12-19 VITALS — BP 96/60 | HR 53 | Ht 63.0 in | Wt 144.6 lb

## 2015-12-19 DIAGNOSIS — G43019 Migraine without aura, intractable, without status migrainosus: Secondary | ICD-10-CM

## 2015-12-19 MED ORDER — NORTRIPTYLINE HCL 10 MG PO CAPS: ORAL_CAPSULE | ORAL | 3 refills | Status: DC

## 2015-12-19 NOTE — Progress Notes (Signed)
PATIENT: Joan Orozco DOB: 10-13-1985  REASON FOR VISIT: follow up -migraine HISTORY FROM: patient  HISTORY OF PRESENT ILLNESS: Joan Orozco is a 30 year old female with a history of migraine headaches. She returns today for follow-up. At the last visit she was started on propranolol. She states that her headaches have actually gotten worse. She typically would only have 1-2 severe headaches since starting propranolol she has 4-5 severe headaches each month. Her headaches typically occur behind the right eye. With severe headache she does have photophobia, phonophobia, nausea and vomiting. She uses Phenergan for nausea. She states that she does not like the way propranolol makes her feel. She feels that she cannot think clearly while on this medication. She would like to wean off as soon as possible. She returns today for an evaluation.   HISTORY (per Dr. Oliva Bustard notes): Joan Orozco is a 30 y.o. female  Is seen here as a referral from Dr. Knox Royalty.  Joan Orozco is a mother of 2 and reports that she has had migraines for at least 10 years. Initially she was not sure what kind of headaches she had until she talked to a coworker, now husband. She uses an intrauterine device for contraception but has tried birth control patches and injections before these preceded the onset of headaches. It is not clear what led to the migraine development and the patient denies being sleep deprived. She has noticed that alcohol provokes headaches but she drinks very rarely. She is not a tobacco user. She does use caffeine but not in form of coffee. She sometimes drinks green tea, not ice tea and rarely soda. She gave up drinking caffeine related coffee while being pregnant or nursing. Dr. Loma Sender, who became her father-in-law, ordered Imitrex which did help her kind of headaches after they were identified as migraines. She now still has about 10  Per months, one or 2 in a week but no  longer in one cluster as it used to be. This changed about 2 months ago. Her migrainous headaches arise as the day goes on usually after Sharma Covert evening she usually does not wake up with a migrainous headache. They're associated with phonophobia and olfactory triggers, loud sounds are bothersome. She has severe nausea with her headaches. She has vomited at times.  Her headaches usually affect the right more often than the left but they are usually not bilateral.  The headaches affected her prior to what a prescription of Imitrex to a degree where she had no longer the ability to work and had to go home. She will take a hot sharp shower height in a dark room and try not to move or speak. 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  Another trigger is dehydration, MSG in food.  She sometimes couldn't fall asleep and then would wake up when the headaches had past, sometimes she had the migraine all the following day, status migrainosus.   The patient currently has sumatriptan around the generic form of Imitrex available at 50 mA tablets. She took propranolol in the past, but stopped it with her first pregnancy. Lab results from her recent office visit with Dr. Yetta Barre from 10/04/2015 were normal. These included a urinalysis, glucose, and her vision acuity testing. She also has listed when necessary a prescription for tramadol 50 mg and sometimes takes ibuprofen 400-600 mg.  Review of Systems: Out of a complete 14 system review, the patient complains of only the following symptoms, and all other  reviewed systems are negative. Frequent, disabling migraines associated with nausea, phonophobia, photophobia, olfactory triggers. Olfactory aura.   REVIEW OF SYSTEMS: Out of a complete 14 system review of symptoms, the patient complains only of the following symptoms, and all other reviewed systems are negative.  Speech difficulty, memory loss, daytime sleepiness, decreased concentration,  confusion  ALLERGIES: Allergies  Allergen Reactions  . Ciprofloxacin Rash  . Cefdinir [Omni-Pac] Rash    Has tolerated PCN, Ampicillin, Keflex.  Allergy occurred as a child, had localized rash on arm, not hives (took med orally).  Rash could have been coincidental.    HOME MEDICATIONS: Outpatient Medications Prior to Visit  Medication Sig Dispense Refill  . omeprazole (PRILOSEC) 10 MG capsule Take 10 mg by mouth daily as needed.     . promethazine (PHENERGAN) 12.5 MG tablet Take 1 tablet (12.5 mg total) by mouth every 6 (six) hours as needed for nausea or vomiting. 30 tablet 0  . propranolol ER (INDERAL LA) 60 MG 24 hr capsule Take 1 capsule (60 mg total) by mouth at bedtime. 30 capsule 3  . SUMAtriptan (IMITREX) 50 MG tablet Take 1 tablet (50 mg total) by mouth once. 10 tablet 2  . acetaminophen (TYLENOL) 500 MG tablet Take 1,000 mg by mouth every 6 (six) hours as needed for moderate pain. For heartburn    . ibuprofen (ADVIL,MOTRIN) 200 MG tablet Take 400-600 mg by mouth every 6 (six) hours as needed for headache.     No facility-administered medications prior to visit.     PAST MEDICAL HISTORY: Past Medical History:  Diagnosis Date  . Abnormal Pap smear    repeat WNL  . Anxiety    at younger age. was on lexapro  . Asthma    childhood  . History of Bell's palsy   . Infection    bladder, yeast, uri.  . Migraine    frequently    PAST SURGICAL HISTORY: Past Surgical History:  Procedure Laterality Date  . CYSTOSCOPY WITH URETEROSCOPY, STONE BASKETRY AND STENT PLACEMENT Right 12/08/2013   Procedure: CYSTO/RIGHT URETERAL STENT PLACEMENT/STONE EXTRACTION;  Surgeon: Chelsea AusStephen M Dahlstedt, MD;  [facility];  Service: Urology;  Laterality: Right;  . HOLMIUM LASER APPLICATION Right 12/08/2013   Procedure: HOLMIUM LASER LITHOTRIPSY;  Surgeon: Chelsea AusStephen M Dahlstedt, MD;  [facility];  Service: Urology;  Laterality: Right;  . TOOTH  EXTRACTION    . WISDOM TOOTH EXTRACTION      FAMILY HISTORY: Family History  Problem Relation Age of Onset  . Adopted: Yes  . ADD / ADHD Father     SOCIAL HISTORY: Social History   Social History  . Marital status: Married    Spouse name: N/A  . Number of children: N/A  . Years of education: N/A   Occupational History  . Not on file.   Social History Main Topics  . Smoking status: Former Smoker    Types: Cigarettes    Quit date: 10/15/2004  . Smokeless tobacco: Never Used  . Alcohol use No     Comment: social  . Drug use: No     Comment: not for many years  . Sexual activity: Not Currently    Partners: Male    Birth control/ protection: IUD   Other Topics Concern  . Not on file   Social History Narrative  . No narrative on file      PHYSICAL EXAM  Vitals:   12/19/15 1405  BP: 96/60  Pulse: Marland Kitchen(!)  53  Weight: 144 lb 9.6 oz (65.6 kg)  Height: 5\' 3"  (1.6 m)   Body mass index is 25.61 kg/m.  Generalized: Well developed, in no acute distress   Neurological examination  Mentation: Alert oriented to time, place, history taking. Follows all commands speech and language fluent Cranial nerve II-XII: Pupils were equal round reactive to light. Extraocular movements were full, visual field were full on confrontational test. Facial sensation and strength were normal. Uvula tongue midline. Head turning and shoulder shrug  were normal and symmetric. Motor: The motor testing reveals 5 over 5 strength of all 4 extremities. Good symmetric motor tone is noted throughout.  Sensory: Sensory testing is intact to soft touch on all 4 extremities. No evidence of extinction is noted.  Coordination: Cerebellar testing reveals good finger-nose-finger and heel-to-shin bilaterally.  Gait and station: Gait is normal. Tandem gait is normal. Romberg is negative. No drift is seen.  Reflexes: Deep tendon reflexes are symmetric and normal bilaterally.   DIAGNOSTIC DATA (LABS, IMAGING,  TESTING) - I reviewed patient records, labs, notes, testing and imaging myself where available.  Lab Results  Component Value Date   WBC 10.8 (H) 06/23/2013   HGB 13.2 12/08/2013   HCT 22.8 (L) 06/23/2013   MCV 78.9 06/23/2013   PLT 190 06/23/2013      Component Value Date/Time   NA 141 11/06/2015 1510   K 4.3 11/06/2015 1510   CL 103 11/06/2015 1510   CO2 22 11/06/2015 1510   GLUCOSE 92 11/06/2015 1510   BUN 9 11/06/2015 1510   CREATININE 0.92 11/06/2015 1510   CALCIUM 8.9 11/06/2015 1510   PROT 7.1 11/06/2015 1510   ALBUMIN 4.6 11/06/2015 1510   AST 16 11/06/2015 1510   ALT 11 11/06/2015 1510   ALKPHOS 66 11/06/2015 1510   BILITOT 0.4 11/06/2015 1510   GFRNONAA 84 11/06/2015 1510   GFRAA 97 11/06/2015 1510      ASSESSMENT AND PLAN 30 y.o. year old female  has a past medical history of Abnormal Pap smear; Anxiety; Asthma; History of Bell's palsy; Infection; and Migraine. here with:  1. Migraine headaches  The patient has been unable to tolerate propranolol. We will slowly wean the patient off the medication. She will begin by taking 1 tablet every other day for 1 week then reduce to one tablet every 2 days then stop medication. She will start on nortriptyline 10 mg at bedtime for 1 week then increase to 20 mg at bedtime. I have reviewed the side effects of nortriptyline with the patient and provided her with a handout. Advised patient that if her headache frequency or severity does not improve she should let us know. Will follow-up in 2 months or sooner if needed.  Butch PennyMegan Janise Gora, MSN, NP-C 12/19/2015, 2:12 PM [facility] [address], [address] [place], [place] [address] [phone]

## 2015-12-19 NOTE — Progress Notes (Signed)
I agree with the assessment and plan as directed by NP .The patient is known to me .   Sedric Guia, MD  

## 2015-12-19 NOTE — Patient Instructions (Signed)
Start nortriptyline 10 mg- 1 tablet by mouth at bedtime for 1 week then increase 2 tablets at bedtime thereafter Wean off Propranolol. Take 1 tablet every other day for 1 week, then every 2 days then stop the medication.  Continue to use imitrex If your symptoms worsen or you develop new symptoms please let us know.  Nortriptyline capsules What is this medicine? NORTRIPTYLINE (nor TRIP ti leen) is used to treat depression. This medicine may be used for other purposes; ask your health care provider or pharmacist if you have questions. What should I tell my health care provider before I take this medicine? They need to know if you have any of these conditions: -an alcohol problem -bipolar disorder or schizophrenia -difficulty passing urine, prostate trouble -glaucoma -heart disease or recent heart attack -liver disease -over active thyroid -seizures -thoughts or plans of suicide or a previous suicide attempt or family history of suicide attempt -an unusual or allergic reaction to nortriptyline, other medicines, foods, dyes, or preservatives -pregnant or trying to get pregnant -breast-feeding How should I use this medicine? Take this medicine by mouth with a glass of water. Follow the directions on the prescription label. Take your doses at regular intervals. Do not take it more often than directed. Do not stop taking this medicine suddenly except upon the advice of your doctor. Stopping this medicine too quickly may cause serious side effects or your condition may worsen. A special MedGuide will be given to you by the pharmacist with each prescription and refill. Be sure to read this information carefully each time. Talk to your pediatrician regarding the use of this medicine in children. Special care may be needed. Overdosage: If you think you have taken too much of this medicine contact a poison control center or emergency room at once. NOTE: This medicine is only for you. Do not share  this medicine with others. What if I miss a dose? If you miss a dose, take it as soon as you can. If it is almost time for your next dose, take only that dose. Do not take double or extra doses. What may interact with this medicine? Do not take this medicine with any of the following medications: -arsenic trioxide -certain medicines medicines for irregular heart beat -cisapride -halofantrine -linezolid -MAOIs like Carbex, Eldepryl, Marplan, Nardil, and Parnate -methylene blue (injected into a vein) -other medicines for mental depression -phenothiazines like perphenazine, thioridazine and chlorpromazine -pimozide -probucol -procarbazine -sparfloxacin -St. John's Wort -ziprasidone This medicine may also interact with any of the following medications: -atropine and related drugs like hyoscyamine, scopolamine, tolterodine and others -barbiturate medicines for inducing sleep or treating seizures, such as phenobarbital -cimetidine -medicines for diabetes -medicines for seizures like carbamazepine or phenytoin -reserpine -thyroid medicine This list may not describe all possible interactions. Give your health care provider a list of all the medicines, herbs, non-prescription drugs, or dietary supplements you use. Also tell them if you smoke, drink alcohol, or use illegal drugs. Some items may interact with your medicine. What should I watch for while using this medicine? Tell your doctor if your symptoms do not get better or if they get worse. Visit your doctor or health care professional for regular checks on your progress. Because it may take several weeks to see the full effects of this medicine, it is important to continue your treatment as prescribed by your doctor. Patients and their families should watch out for new or worsening thoughts of suicide or depression. Also watch out for sudden  changes in feelings such as feeling anxious, agitated, panicky, irritable, hostile, aggressive,  impulsive, severely restless, overly excited and hyperactive, or not being able to sleep. If this happens, especially at the beginning of treatment or after a change in dose, call your health care professional. Bonita QuinYou may get drowsy or dizzy. Do not drive, use machinery, or do anything that needs mental alertness until you know how this medicine affects you. Do not stand or sit up quickly, especially if you are an older patient. This reduces the risk of dizzy or fainting spells. Alcohol may interfere with the effect of this medicine. Avoid alcoholic drinks. Do not treat yourself for coughs, colds, or allergies without asking your doctor or health care professional for advice. Some ingredients can increase possible side effects. Your mouth may get dry. Chewing sugarless gum or sucking hard candy, and drinking plenty of water may help. Contact your doctor if the problem does not go away or is severe. This medicine may cause dry eyes and blurred vision. If you wear contact lenses you may feel some discomfort. Lubricating drops may help. See your eye doctor if the problem does not go away or is severe. This medicine can cause constipation. Try to have a bowel movement at least every 2 to 3 days. If you do not have a bowel movement for 3 days, call your doctor or health care professional. This medicine can make you more sensitive to the sun. Keep out of the sun. If you cannot avoid being in the sun, wear protective clothing and use sunscreen. Do not use sun lamps or tanning beds/booths. What side effects may I notice from receiving this medicine? Side effects that you should report to your doctor or health care professional as soon as possible: -allergic reactions like skin rash, itching or hives, swelling of the face, lips, or tongue -abnormal production of milk in females -breast enlargement in both males and females -breathing problems -confusion, hallucinations -fever with increased sweating -irregular or  fast, pounding heartbeat -muscle stiffness, or spasms -pain or difficulty passing urine, loss of bladder control -seizures -suicidal thoughts or other mood changes -swelling of the testicles -tingling, pain, or numbness in the feet or hands -yellowing of the eyes or skin Side effects that usually do not require medical attention (report to your doctor or health care professional if they continue or are bothersome): -change in sex drive or performance -diarrhea -nausea, vomiting -weight gain or loss This list may not describe all possible side effects. Call your doctor for medical advice about side effects. You may report side effects to FDA at 1-800-FDA-1088. Where should I keep my medicine? Keep out of the reach of children. Store at room temperature between 15 and 30 degrees C (59 and 86 degrees F). Keep container tightly closed. Throw away any unused medicine after the expiration date. NOTE: This sheet is a summary. It may not cover all possible information. If you have questions about this medicine, talk to your doctor, pharmacist, or health care provider.    2016, [employer]vier/Gold Standard. (2011-09-01 13:57:12)

## 2016-02-18 ENCOUNTER — Encounter: Payer: Self-pay | Admitting: Adult Health

## 2016-02-18 ENCOUNTER — Ambulatory Visit (INDEPENDENT_AMBULATORY_CARE_PROVIDER_SITE_OTHER): Payer: BLUE CROSS/BLUE SHIELD | Admitting: Adult Health

## 2016-02-18 VITALS — BP 110/68 | HR 76 | Resp 12 | Ht 63.0 in | Wt 141.5 lb

## 2016-02-18 DIAGNOSIS — G43009 Migraine without aura, not intractable, without status migrainosus: Secondary | ICD-10-CM | POA: Diagnosis not present

## 2016-02-18 MED ORDER — NORTRIPTYLINE HCL 10 MG PO CAPS: 20.0000 mg | ORAL_CAPSULE | Freq: Every day | ORAL | 11 refills | Status: DC

## 2016-02-18 MED ORDER — SUMATRIPTAN SUCCINATE 50 MG PO TABS: ORAL_TABLET | ORAL | 11 refills | Status: AC

## 2016-02-18 NOTE — Progress Notes (Signed)
PATIENT: Joan Orozco DOB: [birth year]  REASON FOR VISIT: follow up HISTORY FROM: patient  HISTORY OF PRESENT ILLNESS: Today 02/18/2016: Joan Orozco is a 30 year old female with a history of migraine headaches. She returns today for follow-up. At the last visit the patient was weaned off propranolol. She was started on nortriptyline. She is currently taking 10 mg at bedtime. She reports that her headaches have improved. She had 2 headaches this month. Her headaches typically occur behind the right eye. She confirms photophobia, but no longer has phonophobia and nausea or vomiting. She does continue to use Imitrex with good benefit. She states that she did not increase nortriptyline to 20 mg because when she first started taking it she did not have any headaches. She returns today for an evaluation.  HISTORY 12/19/15(Eason Housman): Joan Orozco is a 30 year old female with a history of migraine headaches. She returns today for follow-up. At the last visit she was started on propranolol. She states that her headaches have actually gotten worse. She typically would only have 1-2 severe headaches since starting propranolol she has 4-5 severe headaches each month. Her headaches typically occur behind the right eye. With severe headache she does have photophobia, phonophobia, nausea and vomiting. She uses Phenergan for nausea. She states that she does not like the way propranolol makes her feel. She feels that she cannot think clearly while on this medication. She would like to wean off as soon as possible. She returns today for an evaluation.   HISTORY (per Dr. Oliva Bustard notes): Joan Orozco a 30 y.o.femaleIs seen here as a referral from Dr. Knox Royalty.  Joan Orozco is a mother of 2 and reports that she has had migraines for at least 10 years. Initially she was not sure what kind of headaches she had until she talked to a coworker, now husband. She uses an intrauterine device for  contraception but has tried birth control patches and injections before these preceded the onset of headaches. It is not clear what led to the migraine development and the patient denies being sleep deprived. She has noticed that alcohol provokes headaches but she drinks very rarely. She is not a tobacco user. She does use caffeine but not in form of coffee. She sometimes drinks green tea, not ice tea and rarely soda. She gave up drinking caffeine related coffee while being pregnant or nursing. Dr. Loma Sender, who became her father-in-law, ordered Imitrex which did help her kind of headaches after they were identified as migraines. She now still has about 10 Per months, one or 2 in a week but no longer in one cluster as it used to be. This changed about 2 months ago. Her migrainous headaches arise as the day goes on usually after Sharma Covert evening she usually does not wake up with a migrainous headache. They're associated with phonophobia and olfactory triggers, loud sounds are bothersome. She has severe nausea with her headaches. She has vomited at times. Her headaches usually affect the right more often than the left but they are usually not bilateral.  The headaches affected her prior to what a prescription of Imitrex to a degree where she had no longer the ability to work and had to go home. She will take a hot sharp shower height in a dark room and try not to move or speak. 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 Another trigger is dehydration, MSG in food.  She sometimes couldn't fall asleep and then would wake up when the  headaches had past, sometimes she had the migraine all the following day, status migrainosus.   The patient currently has sumatriptan around the generic form of Imitrex available at 50 mA tablets. She took propranolol in the past, but stopped it with her first pregnancy. Lab results from her recent office visit with Dr. Yetta Barre from 10/04/2015 were normal. These  included a urinalysis, glucose, and her vision acuity testing. She also has listed when necessary a prescription for tramadol 50 mg and sometimes takes ibuprofen 400-600 mg.   REVIEW OF SYSTEMS: Out of a complete 14 system review of symptoms, the patient complains only of the following symptoms, and all other reviewed systems are negative.  See history of present illness  ALLERGIES: Allergies  Allergen Reactions  . Ciprofloxacin Rash  . Cefdinir [Omni-Pac] Rash    Has tolerated PCN, Ampicillin, Keflex.  Allergy occurred as a child, had localized rash on arm, not hives (took med orally).  Rash could have been coincidental.    HOME MEDICATIONS: Outpatient Medications Prior to Visit  Medication Sig Dispense Refill  . nortriptyline (PAMELOR) 10 MG capsule Take 1 capsule at bedtime for 1 week then increase to 2 capsules at bedtime. 60 capsule 3  . omeprazole (PRILOSEC) 10 MG capsule Take 10 mg by mouth daily as needed.     . promethazine (PHENERGAN) 12.5 MG tablet Take 1 tablet (12.5 mg total) by mouth every 6 (six) hours as needed for nausea or vomiting. 30 tablet 0  . SUMAtriptan (IMITREX) 50 MG tablet Take 1 tablet (50 mg total) by mouth once. 10 tablet 2   No facility-administered medications prior to visit.     PAST MEDICAL HISTORY: Past Medical History:  Diagnosis Date  . Abnormal Pap smear    repeat WNL  . Anxiety    at younger age. was on lexapro  . Asthma    childhood  . History of Bell's palsy   . Infection    bladder, yeast, uri.  . Migraine    frequently    PAST SURGICAL HISTORY: Past Surgical History:  Procedure Laterality Date  . CYSTOSCOPY WITH URETEROSCOPY, STONE BASKETRY AND STENT PLACEMENT Right 12/08/2013   Procedure: CYSTO/RIGHT URETERAL STENT PLACEMENT/STONE EXTRACTION;  Surgeon: Chelsea Aus, MD;  Location: [facility];  Service: Urology;  Laterality: Right;  . HOLMIUM LASER APPLICATION Right 12/08/2013   Procedure: HOLMIUM LASER  LITHOTRIPSY;  Surgeon: Chelsea Aus, MD;  Location: [facility];  Service: Urology;  Laterality: Right;  . TOOTH EXTRACTION    . WISDOM TOOTH EXTRACTION      FAMILY HISTORY: Family History  Problem Relation Age of Onset  . Adopted: Yes  . ADD / ADHD Father     SOCIAL HISTORY: Social History   Social History  . Marital status: Married    Spouse name: N/A  . Number of children: N/A  . Years of education: N/A   Occupational History  . Not on file.   Social History Main Topics  . Smoking status: Former Smoker    Types: Cigarettes    Quit date: 10/15/2004  . Smokeless tobacco: Never Used  . Alcohol use No     Comment: social  . Drug use: No     Comment: not for many years  . Sexual activity: Not Currently    Partners: Male    Birth control/ protection: IUD   Other Topics Concern  . Not on file   Social History Narrative  . No narrative  on file      PHYSICAL EXAM  Vitals:   02/18/16 1509  BP: 110/68  Pulse: 76  Resp: 12  Weight: 141 lb 8 oz (64.2 kg)  Height: 5\' 3"  (1.6 m)   Body mass index is 25.07 kg/m.  Generalized: Well developed, in no acute distress   Neurological examination  Mentation: Alert oriented to time, place, history taking. Follows all commands speech and language fluent Cranial nerve II-XII: Pupils were equal round reactive to light. Extraocular movements were full, visual field were full on confrontational test. Facial sensation and strength were normal. Uvula tongue midline. Head turning and shoulder shrug  were normal and symmetric. Motor: The motor testing reveals 5 over 5 strength of all 4 extremities. Good symmetric motor tone is noted throughout.  Sensory: Sensory testing is intact to soft touch on all 4 extremities. No evidence of extinction is noted.  Coordination: Cerebellar testing reveals good finger-nose-finger and heel-to-shin bilaterally.  Gait and station: Gait is normal. Tandem gait is normal. Romberg  is negative. No drift is seen.  Reflexes: Deep tendon reflexes are symmetric and normal bilaterally.   DIAGNOSTIC DATA (LABS, IMAGING, TESTING) - I reviewed patient records, labs, notes, testing and imaging myself where available.      Component Value Date/Time   NA 141 11/06/2015 1510   K 4.3 11/06/2015 1510   CL 103 11/06/2015 1510   CO2 22 11/06/2015 1510   GLUCOSE 92 11/06/2015 1510   BUN 9 11/06/2015 1510   CREATININE 0.92 11/06/2015 1510   CALCIUM 8.9 11/06/2015 1510   PROT 7.1 11/06/2015 1510   ALBUMIN 4.6 11/06/2015 1510   AST 16 11/06/2015 1510   ALT 11 11/06/2015 1510   ALKPHOS 66 11/06/2015 1510   BILITOT 0.4 11/06/2015 1510   GFRNONAA 84 11/06/2015 1510   GFRAA 97 11/06/2015 1510      ASSESSMENT AND PLAN 30 y.o. year old female  has a past medical history of Abnormal Pap smear; Anxiety; Asthma; History of Bell's palsy; Infection; and Migraine. here with:  1. Migraine headaches  Overall the patient is doing well. Advised patient that she can increase nortriptyline to 20 mg to see if she gets better benefit. She verbalized understanding. If she is unable to tolerate an increased dose she can reduce her dose back to 10 mg at bedtime. She will continue using Imitrex as needed. Advised that if her headache frequency or severity increases she should let us know. Follow-up in 6 months or sooner if needed.     Butch PennyMegan Jaquala Fuller, MSN, NP-C 02/18/2016, 3:10 PM [facility] [address], [address] [place], [place] [id number] [phone]

## 2016-02-18 NOTE — Patient Instructions (Signed)
Continue Nortriptyline- can increase to 20 mg at bedtime Continue Imitrex if needed If your symptoms worsen or you develop new symptoms please let us know.

## 2016-02-19 NOTE — Progress Notes (Signed)
I agree with the assessment and plan as directed by NP . I was available for consultation.   Thurman Sarver, MD  

## 2016-05-01 ENCOUNTER — Telehealth: Payer: Self-pay | Admitting: Adult Health

## 2016-05-01 NOTE — Telephone Encounter (Signed)
Noted  

## 2016-05-01 NOTE — Telephone Encounter (Signed)
Patient is calling stating medication nortriptyline (PAMELOR) 10 MG capsule is not helping with her migraines.Please call and discuss.

## 2016-05-01 NOTE — Telephone Encounter (Signed)
I called pt. She reports that since increasing the nortriptyline to 20mg  she has felt worse than when she was not taking the nortriptyline. Pt wants to discuss taking other medications for her migraines. She has tried propranolol and "something else" in the past for migraines but it was ineffective. Pt does take imitrex prn migraine. Pt says that she is going to stop taking the nortriptyline. I made pt a sooner appt with Aundra MilletMegan, NP for 05/27/16. (Pt has a difficult schedule, this is the soonest day that she could come.) I encouraged pt to keep a migraine diary that includes when her migraine started, how long it lasted, what did she do to treat it, prior to migraine what did she eat/stressors/sleep, etc.  I advised pt that I would send this message to [facility], NP to let her know that pt will be stopping nortriptyline and a sooner appt was made to discuss other migraine medications. If Aundra MilletMegan, NP has any other recommendations, I will call the pt to discuss.

## 2016-05-27 ENCOUNTER — Encounter: Payer: Self-pay | Admitting: Adult Health

## 2016-05-27 ENCOUNTER — Ambulatory Visit (INDEPENDENT_AMBULATORY_CARE_PROVIDER_SITE_OTHER): Payer: BLUE CROSS/BLUE SHIELD | Admitting: Adult Health

## 2016-05-27 VITALS — BP 109/75 | HR 63 | Ht 63.0 in | Wt 145.0 lb

## 2016-05-27 DIAGNOSIS — G43009 Migraine without aura, not intractable, without status migrainosus: Secondary | ICD-10-CM

## 2016-05-27 NOTE — Progress Notes (Signed)
PATIENT: Joan SavoyJennifer B Orozco DOB: 12/17/1985  REASON FOR VISIT: follow up-migraine headaches HISTORY FROM: patient  HISTORY OF PRESENT ILLNESS: Joan Orozco is a 31 year old female with a history of migraine headaches. She returns today for follow-up. She states that she stopped Imitrex at the beginning of January. She reports that she could not tolerate the medication. She felt as if she was dehydrated when she took this medication. She states that since she stopped the medication she had 4 migraines in the month of January. She states that the first migraine was when she had a sinus infection. The second migraine was when she was at a birthday party and also had age cheese for dinner. She states that she is unsure about the last 2 headaches. She feels that having 4 headaches a month is manageable. She states that she can either use Benadryl or sumatriptan with good benefit. She does not particularly want to be on a daily medication unless her headache frequency worsens. She does know her triggers are chocolate, wine and aged cheese. She does try to avoid this when possible. Reports that her headache location can vary. Reports that when her headaches occur on the right side this is associated with photophobia and phonophobia. When her headaches occur on the left side she has nausea and vomiting. She states in the past her headaches have been more frequent and this of course prompted her to be on a daily medication. She returns today for an evaluation.  HISTORY 02/18/16: Joan Orozco is a 31 year old female with a history of migraine headaches. She returns today for follow-up. At the last visit the patient was weaned off propranolol. She was started on nortriptyline. She is currently taking 10 mg at bedtime. She reports that her headaches have improved. She had 2 headaches this month. Her headaches typically occur behind the right eye. She confirms photophobia, but no longer has phonophobia and nausea  or vomiting. She does continue to use Imitrex with good benefit. She states that she did not increase nortriptyline to 20 mg because when she first started taking it she did not have any headaches. She returns today for an evaluation.  HISTORY 12/19/15(Serenity Batley): Joan Orozco is a 31 year old female with a history of migraine headaches. She returns today for follow-up. At the last visit she was started on propranolol. She states that her headaches have actually gotten worse. She typically would only have 1-2 severe headaches since starting propranolol she has 4-5 severe headaches each month. Her headaches typically occur behind the right eye. With severe headache she does have photophobia, phonophobia, nausea and vomiting. She uses Phenergan for nausea. She states that she does not like the way propranolol makes her feel. She feels that she cannot think clearly while on this medication. She would like to wean off as soon as possible. She returns today for an evaluation.   HISTORY (per Joan Orozco's notes): Joan DikeJennifer B Phillipsis a 31 y.o.femaleIs seen here as a referral from Dr. Knox RoyaltyEnrico Jones.  [name] is a mother of 2 and reports that she has had migraines for at least 10 years. Initially she was not sure what kind of headaches she had until she talked to a coworker, now husband. She uses an intrauterine device for contraception but has tried birth control patches and injections before these preceded the onset of headaches. It is not clear what led to the migraine development and the patient denies being sleep deprived. She has noticed that alcohol provokes headaches but she  drinks very rarely. She is not a tobacco user. She does use caffeine but not in form of coffee. She sometimes drinks green tea, not ice tea and rarely soda. She gave up drinking caffeine related coffee while being pregnant or nursing. Dr. Loma Sender, who became her father-in-law, ordered Imitrex which did help her kind of  headaches after they were identified as migraines. She now still has about 10 Per months, one or 2 in a week but no longer in one cluster as it used to be. This changed about 2 months ago. Her migrainous headaches arise as the day goes on usually after Sharma Covert evening she usually does not wake up with a migrainous headache. They're associated with phonophobia and olfactory triggers, loud sounds are bothersome. She has severe nausea with her headaches. She has vomited at times. Her headaches usually affect the right more often than the left but they are usually not bilateral.  The headaches affected her prior to what a prescription of Imitrex to a degree where she had no longer the ability to work and had to go home. She will take a hot sharp shower height in a dark room and try not to move or speak. 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 Another trigger is dehydration, MSG in food.  She sometimes couldn't fall asleep and then would wake up when the headaches had past, sometimes she had the migraine all the following day, status migrainosus.   The patient currently has sumatriptan around the generic form of Imitrex available at 50 mA tablets. She took propranolol in the past, but stopped it with her first pregnancy. Lab results from her recent office visit with Dr. Yetta Barre from 10/04/2015 were normal. These included a urinalysis, glucose, and her vision acuity testing. She also has listed when necessary a prescription for tramadol 50 mg and sometimes takes ibuprofen 400-600 mg.  REVIEW OF SYSTEMS: Out of a complete 14 system review of symptoms, the patient complains only of the following symptoms, and all other reviewed systems are negative.  See history of present illness  ALLERGIES: Allergies  Allergen Reactions  . Ciprofloxacin Rash  . Cefdinir [Omni-Pac] Rash    Has tolerated PCN, Ampicillin, Keflex.  Allergy occurred as a child, had localized rash on arm, not hives (took med  orally).  Rash could have been coincidental.    HOME MEDICATIONS: Outpatient Medications Prior to Visit  Medication Sig Dispense Refill  . omeprazole (PRILOSEC) 10 MG capsule Take 10 mg by mouth daily as needed.     . promethazine (PHENERGAN) 12.5 MG tablet Take 1 tablet (12.5 mg total) by mouth every 6 (six) hours as needed for nausea or vomiting. 30 tablet 0  . SUMAtriptan (IMITREX) 50 MG tablet Take 1 tablet by mouth at onset of migraine. Can repeat in 2 hours if needed. Not to exceed two tablets in 24 hours. 10 tablet 11  . nortriptyline (PAMELOR) 10 MG capsule Take 2 capsules (20 mg total) by mouth at bedtime. 60 capsule 11   No facility-administered medications prior to visit.     PAST MEDICAL HISTORY: Past Medical History:  Diagnosis Date  . Abnormal Pap smear    repeat WNL  . Anxiety    at younger age. was on lexapro  . Asthma    childhood  . History of Bell's palsy   . Infection    bladder, yeast, uri.  . Migraine    frequently    PAST SURGICAL HISTORY: Past Surgical History:  Procedure Laterality Date  . CYSTOSCOPY WITH URETEROSCOPY, STONE BASKETRY AND STENT PLACEMENT Right 12/08/2013   Procedure: CYSTO/RIGHT URETERAL STENT PLACEMENT/STONE EXTRACTION;  Surgeon: Chelsea Aus, MD;  Location: [facility];  Service: Urology;  Laterality: Right;  . HOLMIUM LASER APPLICATION Right 12/08/2013   Procedure: HOLMIUM LASER LITHOTRIPSY;  Surgeon: Chelsea Aus, MD;  Location: [facility];  Service: Urology;  Laterality: Right;  . TOOTH EXTRACTION    . WISDOM TOOTH EXTRACTION      FAMILY HISTORY: Family History  Problem Relation Age of Onset  . Adopted: Yes  . ADD / ADHD Father     SOCIAL HISTORY: Social History   Social History  . Marital status: Married    Spouse name: N/A  . Number of children: N/A  . Years of education: N/A   Occupational History  . Not on file.   Social History Main Topics  . Smoking status:  Former Smoker    Types: Cigarettes    Quit date: 10/15/2004  . Smokeless tobacco: Never Used  . Alcohol use No     Comment: social  . Drug use: No     Comment: not for many years  . Sexual activity: Not Currently    Partners: Male    Birth control/ protection: IUD   Other Topics Concern  . Not on file   Social History Narrative  . No narrative on file      PHYSICAL EXAM  Vitals:   05/27/16 1057  BP: 109/75  Pulse: 63  Weight: 145 lb (65.8 kg)  Height: 5\' 3"  (1.6 m)   Body mass index is 25.69 kg/m.  Generalized: Well developed, in no acute distress   Neurological examination  Mentation: Alert oriented to time, place, history taking. Follows all commands speech and language fluent Cranial nerve II-XII: Pupils were equal round reactive to light. Extraocular movements were full, visual field were full on confrontational test. Facial sensation and strength were normal. Uvula tongue midline. Head turning and shoulder shrug  were normal and symmetric. Motor: The motor testing reveals 5 over 5 strength of all 4 extremities. Good symmetric motor tone is noted throughout.  Sensory: Sensory testing is intact to soft touch on all 4 extremities. No evidence of extinction is noted.  Coordination: Cerebellar testing reveals good finger-nose-finger and heel-to-shin bilaterally.  Gait and station: Gait is normal. Tandem gait is normal. Romberg is negative. No drift is seen.  Reflexes: Deep tendon reflexes are symmetric and normal bilaterally.   DIAGNOSTIC DATA (LABS, IMAGING, TESTING) - I reviewed patient records, labs, notes, testing and imaging myself where available.  Lab Results  Component Value Date   WBC 10.8 (H) 06/23/2013   HGB 13.2 12/08/2013   HCT 22.8 (L) 06/23/2013   MCV 78.9 06/23/2013   PLT 190 06/23/2013      Component Value Date/Time   NA 141 11/06/2015 1510   K 4.3 11/06/2015 1510   CL 103 11/06/2015 1510   CO2 22 11/06/2015 1510   GLUCOSE 92 11/06/2015  1510   BUN 9 11/06/2015 1510   CREATININE 0.92 11/06/2015 1510   CALCIUM 8.9 11/06/2015 1510   PROT 7.1 11/06/2015 1510   ALBUMIN 4.6 11/06/2015 1510   AST 16 11/06/2015 1510   ALT 11 11/06/2015 1510   ALKPHOS 66 11/06/2015 1510   BILITOT 0.4 11/06/2015 1510   GFRNONAA 84 11/06/2015 1510   GFRAA 97 11/06/2015 1510      ASSESSMENT AND PLAN 30 y.o. year  old female  has a past medical history of Abnormal Pap smear; Anxiety; Asthma; History of Bell's palsy; Infection; and Migraine. here with:  1. Migraine headaches  The patient had approximately 4 migraine headaches this month. At this time she feels that this is manageable. We will not start any preventative medication at this time. The patient will continue to use Imitrex to treat her acute headache. We did discuss overuse of Imitrex and the potential risk associated with that. She verbalized understanding. I advised the patient that if her headache frequency increases she will need to be on a preventative medication. She voiced understanding. She will return in 6 months or sooner if needed.  I spent 15 minutes with the patient 50% of this time was spent discussing migraine triggers, future medication and Imitrex overuse.   Butch Penny, MSN, NP-C 05/27/2016, 12:00 PM [facility] [address] [place], [place] [address] [phone]

## 2016-05-27 NOTE — Patient Instructions (Signed)
Can use Imitrex for acute headache therapy. If you begin to have 2 or more headaches a week we will need to consider a preventative medication. If your symptoms worsen or you develop new symptoms please let us know.

## 2016-05-27 NOTE — Progress Notes (Signed)
I agree with the assessment and plan as directed by NP .The patient is known to me .   Jody Silas, MD  

## 2016-08-18 ENCOUNTER — Ambulatory Visit: Payer: BLUE CROSS/BLUE SHIELD | Admitting: Adult Health

## 2016-08-27 NOTE — Telephone Encounter (Signed)
This encounter was created in error - please disregard.

## 2016-12-02 ENCOUNTER — Ambulatory Visit: Payer: BLUE CROSS/BLUE SHIELD | Admitting: Adult Health

## 2016-12-03 ENCOUNTER — Encounter: Payer: Self-pay | Admitting: Adult Health

## 2016-12-15 ENCOUNTER — Encounter (HOSPITAL_COMMUNITY): Payer: Self-pay | Admitting: *Deleted

## 2016-12-15 ENCOUNTER — Inpatient Hospital Stay (HOSPITAL_COMMUNITY)
Admission: AD | Admit: 2016-12-15 | Discharge: 2016-12-15 | Disposition: A | Discharge status: Home / Self Care | Payer: BLUE CROSS/BLUE SHIELD | Source: Ambulatory Visit | Attending: Obstetrics & Gynecology | Admitting: Obstetrics & Gynecology

## 2016-12-15 ENCOUNTER — Other Ambulatory Visit: Payer: Self-pay | Admitting: Obstetrics & Gynecology

## 2016-12-15 DIAGNOSIS — O00109 Unspecified tubal pregnancy without intrauterine pregnancy: Secondary | ICD-10-CM | POA: Insufficient documentation

## 2016-12-15 DIAGNOSIS — O009 Unspecified ectopic pregnancy without intrauterine pregnancy: Secondary | ICD-10-CM

## 2016-12-15 HISTORY — DX: Depression, unspecified: F32.A

## 2016-12-15 HISTORY — DX: Calculus of kidney: N20.0

## 2016-12-15 HISTORY — DX: Unspecified abnormal cytological findings in specimens from vagina: R87.629

## 2016-12-15 HISTORY — DX: Major depressive disorder, single episode, unspecified: F32.9

## 2016-12-15 LAB — CBC WITH DIFFERENTIAL/PLATELET
BASOS ABS: 0 10*3/uL (ref 0.0–0.1)
Basophils Relative: 0 %
EOS ABS: 0.2 10*3/uL (ref 0.0–0.7)
EOS PCT: 2 %
HCT: 37.7 % (ref 36.0–46.0)
Hemoglobin: 12.5 g/dL (ref 12.0–15.0)
LYMPHS PCT: 35 %
Lymphs Abs: 2.3 10*3/uL (ref 0.7–4.0)
MCH: 29.3 pg (ref 26.0–34.0)
MCHC: 33.2 g/dL (ref 30.0–36.0)
MCV: 88.3 fL (ref 78.0–100.0)
MONO ABS: 0.3 10*3/uL (ref 0.1–1.0)
Monocytes Relative: 4 %
Neutro Abs: 3.9 10*3/uL (ref 1.7–7.7)
Neutrophils Relative %: 59 %
PLATELETS: 240 10*3/uL (ref 150–400)
RBC: 4.27 MIL/uL (ref 3.87–5.11)
RDW: 12.6 % (ref 11.5–15.5)
WBC: 6.7 10*3/uL (ref 4.0–10.5)

## 2016-12-15 LAB — TYPE AND SCREEN
ABO/RH(D): A POS
Antibody Screen: NEGATIVE

## 2016-12-15 LAB — BUN: BUN: 17 mg/dL (ref 6–20)

## 2016-12-15 LAB — CREATININE, SERUM
CREATININE: 0.9 mg/dL (ref 0.44–1.00)
GFR calc Af Amer: >60 mL/min (ref >60)
GFR calc non Af Amer: >60 mL/min (ref >60)

## 2016-12-15 LAB — AST: AST: 20 U/L (ref 15–41)

## 2016-12-15 MED ORDER — METHOTREXATE INJECTION FOR WOMEN'S HOSPITAL
50.0000 mg/m2 | Freq: Once | INTRAMUSCULAR | Status: AC
  Administered 2016-12-15: 85 mg via INTRAMUSCULAR
  Filled 2016-12-15: qty 1.7

## 2016-12-15 NOTE — MAU Note (Signed)
Has been getting hormone levels drawn, up and down, up and down. On [place], no IUGS, something ? On the left.  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  Had a sharp shooting pain into her groin today.

## 2016-12-15 NOTE — Discharge Instructions (Signed)
Methotrexate Treatment for an Ectopic Pregnancy, Care After °Refer to this sheet in the next few weeks. These instructions provide you with information on caring for yourself after your procedure. Your health care provider may also give you more specific instructions. Your treatment has been planned according to current medical practices, but problems sometimes occur. Call your health care provider if you have any problems or questions after your procedure. °What can I expect after the procedure? °You may have some abdominal cramping, vaginal bleeding, and fatigue in the first few days after taking methotrexate. Some other possible side effects of methotrexate include: °· Nausea. °· Vomiting. °· Diarrhea. °· Mouth sores. °· Swelling or irritation of the lining of your lungs (pneumonitis). °· Liver damage. °· Hair loss. ° °Follow these instructions at home: °After you have received the methotrexate medicine, you need to be careful of your activities and watch your condition for several weeks. It may take 1 week before your hormone levels return to normal. °Activity °· Do not have sexual intercourse until your health care provider says it is safe to do so. °· You may resume your usual diet. °· Limit strenuous activity. °· Do not drink alcohol. °General instructions °· Do not take aspirin, ibuprofen, or naproxen (nonsteroidal anti-inflammatory drugs [NSAIDs]). °· Do not take folic acid, prenatal vitamins, or other vitamins that contain folic acid. °· Avoid traveling too far away from your health care provider. °· Keep all follow-up visits as told by your health care provider. This is important. °Contact a health care provider if: °· You cannot control your nausea and vomiting. °· You cannot control your diarrhea. °· You have sores in your mouth and want treatment. °· You need pain medicine for your abdominal pain. °· You have a rash. °· You are having a reaction to the medicine. °Get help right away if: °· You have  increasing abdominal or pelvic pain. °· You notice increased bleeding. °· You feel light-headed, or you faint. °· You have shortness of breath. °· Your heart rate increases. °· You have a cough. °· You have chills. °· You have a fever. °This information is not intended to replace advice given to you by your health care provider. Make sure you discuss any questions you have with your health care provider. °Document Released: 04/03/2011 Document Revised: 09/20/2015 Document Reviewed: 01/31/2013 °[employer] Interactive Patient Education © 2017 [employer] ° °

## 2016-12-17 ENCOUNTER — Telehealth: Payer: Self-pay | Admitting: Adult Health

## 2016-12-17 NOTE — Telephone Encounter (Signed)
LMVM for pt, reliterated the message I received.  I will check on if ok to take imitrex after your procedure and injection yesterday.

## 2016-12-17 NOTE — Telephone Encounter (Signed)
Andrey Campanile can you call patient and relay Dr. Oliva Bustard message

## 2016-12-17 NOTE — Telephone Encounter (Signed)
She needs to hydrate well, she can take imitrex, and ibuprofen . CD

## 2016-12-17 NOTE — Telephone Encounter (Signed)
I called Joan Orozco and relayed the information below as per Dr. Oliva Bustard recommendation.  She stated she spoke to Dr. Vickey Huger and Joan Orozco wanted to let her know that she really appreciated her calling back and speaking to her.

## 2016-12-17 NOTE — Telephone Encounter (Signed)
There is no interaction between methotrexate and Imitrex. However in regards to the ectopic pregnancy-I will consult with her OB/GYN for taking Imitrex. I will also forward this to Dr. Vickey Huger for her to get her input as well.

## 2016-12-17 NOTE — Telephone Encounter (Signed)
Pt called the clinic she was seen in ED 8/20 for ectopic pregnancy. She was given injection of methotrexate. She is wanting to know if she can take SUMAtriptan (IMITREX) 50 MG tablet for migraine that started this morning. Pt is very confusing to talk with. Please call to discuss.

## 2017-02-27 ENCOUNTER — Other Ambulatory Visit: Payer: Self-pay | Admitting: Adult Health

## 2017-12-14 ENCOUNTER — Encounter (HOSPITAL_COMMUNITY): Payer: Self-pay
